# Patient Record
Sex: Male | Born: 1975 | Race: White | Hispanic: No | Marital: Married | Smoking: Former smoker
Health system: Southern US, Community
[De-identification: ages and names within clinical notes are randomized; demographics above are authoritative.]

## PROBLEM LIST (undated history)

## (undated) DIAGNOSIS — T7840XA Allergy, unspecified, initial encounter: Secondary | ICD-10-CM

## (undated) DIAGNOSIS — K08409 Partial loss of teeth, unspecified cause, unspecified class: Secondary | ICD-10-CM

## (undated) DIAGNOSIS — S92909A Unspecified fracture of unspecified foot, initial encounter for closed fracture: Secondary | ICD-10-CM

## (undated) HISTORY — DX: Allergy, unspecified, initial encounter: T78.40XA

## (undated) HISTORY — DX: Partial loss of teeth, unspecified cause, unspecified class: K08.409

## (undated) HISTORY — PX: MOUTH SURGERY: SHX715

## (undated) HISTORY — DX: Unspecified fracture of unspecified foot, initial encounter for closed fracture: S92.909A

---

## 2004-12-07 ENCOUNTER — Ambulatory Visit: Payer: Self-pay | Admitting: Family Medicine

## 2006-04-25 ENCOUNTER — Ambulatory Visit: Payer: Self-pay | Admitting: Internal Medicine

## 2006-12-04 DIAGNOSIS — J309 Allergic rhinitis, unspecified: Secondary | ICD-10-CM | POA: Insufficient documentation

## 2007-01-15 ENCOUNTER — Ambulatory Visit: Payer: Self-pay | Admitting: Family Medicine

## 2007-01-15 LAB — CONVERTED CEMR LAB
Albumin: 4.3 g/dL (ref 3.5–5.2)
Alkaline Phosphatase: 44 units/L (ref 39–117)
BUN: 14 mg/dL (ref 6–23)
Basophils Absolute: 0 10*3/uL (ref 0.0–0.1)
CO2: 32 meq/L (ref 19–32)
Cholesterol: 197 mg/dL (ref 0–200)
Eosinophils Absolute: 0.2 10*3/uL (ref 0.0–0.6)
GFR calc Af Amer: 129 mL/min
GFR calc non Af Amer: 107 mL/min
HDL: 61.7 mg/dL (ref >39.0)
Hemoglobin: 15.4 g/dL (ref 13.0–17.0)
Lymphocytes Relative: 35.7 % (ref 12.0–46.0)
MCHC: 34.3 g/dL (ref 30.0–36.0)
MCV: 96.4 fL (ref 78.0–100.0)
Monocytes Absolute: 0.7 10*3/uL (ref 0.2–0.7)
Monocytes Relative: 11.1 % — ABNORMAL HIGH (ref 3.0–11.0)
Neutro Abs: 3.2 10*3/uL (ref 1.4–7.7)
Potassium: 4 meq/L (ref 3.5–5.1)
Total Protein: 7 g/dL (ref 6.0–8.3)
Triglycerides: 82 mg/dL (ref 0–149)

## 2007-01-22 ENCOUNTER — Ambulatory Visit: Payer: Self-pay | Admitting: Family Medicine

## 2008-02-24 ENCOUNTER — Telehealth: Payer: Self-pay | Admitting: Family Medicine

## 2008-10-21 ENCOUNTER — Ambulatory Visit: Payer: Self-pay | Admitting: Family Medicine

## 2008-10-21 DIAGNOSIS — B977 Papillomavirus as the cause of diseases classified elsewhere: Secondary | ICD-10-CM | POA: Insufficient documentation

## 2008-10-21 DIAGNOSIS — H9193 Unspecified hearing loss, bilateral: Secondary | ICD-10-CM | POA: Insufficient documentation

## 2008-11-04 ENCOUNTER — Ambulatory Visit: Payer: Self-pay | Admitting: Family Medicine

## 2009-01-08 ENCOUNTER — Ambulatory Visit: Payer: Self-pay | Admitting: Family Medicine

## 2009-01-08 DIAGNOSIS — D1039 Benign neoplasm of other parts of mouth: Secondary | ICD-10-CM | POA: Insufficient documentation

## 2010-04-22 ENCOUNTER — Ambulatory Visit
Admission: RE | Admit: 2010-04-22 | Discharge: 2010-04-22 | Payer: Self-pay | Source: Home / Self Care | Attending: Family Medicine | Admitting: Family Medicine

## 2010-04-22 DIAGNOSIS — F17200 Nicotine dependence, unspecified, uncomplicated: Secondary | ICD-10-CM | POA: Insufficient documentation

## 2010-04-28 NOTE — Assessment & Plan Note (Signed)
Summary: bump on back of tongue/cold/njr   Vital Signs:  Patient profile:   35 year old male Height:      67.5 inches Weight:      147 pounds BMI:     22.77 Temp:     98.4 degrees F oral BP sitting:   104 / 70  (left arm) Cuff size:   regular  Vitals Entered By: Kern Reap CMA Duncan Dull) (April 22, 2010 11:14 AM) CC: bumps on tongue   CC:  bumps on tongue.  History of Present Illness: Ledarius is a 35 year old male, who comes in today because of a viral syndrome, and some irritation of this posterior tongue, and his real concern is she would like to quit smoking.  He said a viral syndrome for for 5 days.  He noticed some bumps in the back of his tongue.  He looked up on the Internet and found a smoking-related cancer, etc., and now he would   like to quit smoking  He's not been a heavy smoker.  He only smokes about 3 cigarettes per day, but does roll his own  Allergies: No Known Drug Allergies  Past History:  Past medical, surgical, family and social histories (including risk factors) reviewed, and no changes noted (except as noted below).  Past Medical History: Reviewed history from 12/04/2006 and no changes required. Allergic rhinitis Allergies  Past Surgical History: Reviewed history from 12/04/2006 and no changes required. Denies surgical history  Family History: Reviewed history from 12/04/2006 and no changes required. Family History Diabetes 1st degree relative Family History Hypertension Family History of Arthritis  Social History: Reviewed history from 01/22/2007 and no changes required. Occupation:[employer] grass[employer] Single Never Smoked Alcohol use-yes Drug use-no Regular exercise-yes  Review of Systems      See HPI  Physical Exam  General:  Well-developed,well-nourished,in no acute distress; alert,appropriate and cooperative throughout examination Head:  Normocephalic and atraumatic without obvious abnormalities. No apparent alopecia or  balding. Eyes:  No corneal or conjunctival inflammation noted. EOMI. Perrla. Funduscopic exam benign, without hemorrhages, exudates or papilledema. Vision grossly normal. Ears:  External ear exam shows no significant lesions or deformities.  Otoscopic examination reveals clear canals, tympanic membranes are intact bilaterally without bulging, retraction, inflammation or discharge. Hearing is grossly normal bilaterally. Nose:  External nasal examination shows no deformity or inflammation. Nasal mucosa are pink and moist without lesions or exudates. Mouth:  the oral cavity is normal.  There is some hyperpigmentation of the papillae consistent with either a viral syndrome or reaction to tobacco or above Neck:  No deformities, masses, or tenderness noted.   Problems:  Medical Problems Added: 1)  Dx of Tobacco Use  (ICD-305.1) 2)  Dx of Viral Infection-unspec  (ICD-079.99)  Impression & Recommendations:  Problem # 1:  TOBACCO USE (ICD-305.1) Assessment Unchanged  His updated medication list for this problem includes:    Chantix Continuing Month Pak 1 Mg Tabs (Varenicline tartrate) ..... Uad  Orders: Tobacco use cessation intermediate 3-10 minutes (16109)  Problem # 2:  VIRAL INFECTION-UNSPEC (ICD-079.99) Assessment: New  His updated medication list for this problem includes:    Hydromet 5-1.5 Mg/41ml Syrp (Hydrocodone-homatropine) .Marland Kitchen... 1/2 to 1 tsp at bedtime as needed  Orders: Tobacco use cessation intermediate 3-10 minutes (99406)  Complete Medication List: 1)  Claritin 10 Mg Tabs (Loratadine) .... Prn 2)  Flonase 50 Mcg/act Susp (Fluticasone propionate) .... Prn 3)  Hydromet 5-1.5 Mg/81ml Syrp (Hydrocodone-homatropine) .... 1/2 to 1 tsp at bedtime as needed 4)  Chantix Continuing Month Pak 1 Mg Tabs (Varenicline tartrate) .... Uad  Patient Instructions: 1)  for your viral syndrome.  Drink lots of water, Tylenol or Motrin p.r.n., Hydromet one half to 1 teaspoon nightly p.r.n.  cough. 2)  Chantix take one half tab q.a.m. x 4 months and either tapper ror stopped completely.  Now Prescriptions: CHANTIX CONTINUING MONTH PAK 1 MG TABS (VARENICLINE TARTRATE) UAD  #1 x 2   Entered and Authorized by:   Roderick Pee MD   Signed by:   Roderick Pee MD on 04/22/2010   Method used:   Print then Give to Patient   RxID:   [id number] HYDROMET 5-1.5 MG/5ML SYRP (HYDROCODONE-HOMATROPINE) 1/2 to 1 tsp at bedtime as needed  #4oz x 1   Entered and Authorized by:   Roderick Pee MD   Signed by:   Roderick Pee MD on 04/22/2010   Method used:   Print then Give to Patient   RxID:   [id number]    Orders Added: 1)  Est. Patient Level III [84696] 2)  Tobacco use cessation intermediate 3-10 minutes [29528]

## 2010-08-09 ENCOUNTER — Encounter: Payer: Self-pay | Admitting: Family Medicine

## 2010-08-11 ENCOUNTER — Ambulatory Visit: Payer: Self-pay | Admitting: Internal Medicine

## 2010-08-12 ENCOUNTER — Ambulatory Visit: Payer: Self-pay | Admitting: Internal Medicine

## 2010-10-03 ENCOUNTER — Encounter: Payer: Self-pay | Admitting: Family Medicine

## 2010-10-03 ENCOUNTER — Ambulatory Visit (INDEPENDENT_AMBULATORY_CARE_PROVIDER_SITE_OTHER): Payer: BC Managed Care – PPO | Admitting: Family Medicine

## 2010-10-03 VITALS — BP 112/78 | Temp 98.3°F | Wt 149.0 lb

## 2010-10-03 DIAGNOSIS — L247 Irritant contact dermatitis due to plants, except food: Secondary | ICD-10-CM | POA: Insufficient documentation

## 2010-10-03 DIAGNOSIS — L255 Unspecified contact dermatitis due to plants, except food: Secondary | ICD-10-CM

## 2010-10-03 MED ORDER — PREDNISONE 20 MG PO TABS: ORAL_TABLET | ORAL | Status: DC

## 2010-10-03 NOTE — Patient Instructions (Signed)
Take the prednisone as directed.  Return p.r.n. 

## 2010-10-03 NOTE — Progress Notes (Signed)
  Subjective:    Patient ID: Roy Webb, male    DOB: 1975-06-06, 35 y.o.   MRN: [id number]  HPITodd is a 35 year old male, who comes in today for evaluation.  The contact them as tightness for 4 days.  The lesions now are blistered and involved the left arm trunk and face.    Review of Systems General and dermatologic review of systems otherwise negative.    Objective:   Physical Exam    No acute distress.  Examination of the skin shows testicular lesions on the left arm 3 on the face and some on the trunk consistent with a contact dermatitis   Assessment & Plan:  Contact dermatitis.  Plan prednisone burst and taper return p.r.n.

## 2012-02-08 ENCOUNTER — Ambulatory Visit (INDEPENDENT_AMBULATORY_CARE_PROVIDER_SITE_OTHER): Payer: BC Managed Care – PPO | Admitting: Family Medicine

## 2012-02-08 ENCOUNTER — Encounter: Payer: Self-pay | Admitting: Family Medicine

## 2012-02-08 VITALS — BP 110/80 | Temp 98.4°F | Wt 155.0 lb

## 2012-02-08 DIAGNOSIS — S91339A Puncture wound without foreign body, unspecified foot, initial encounter: Secondary | ICD-10-CM

## 2012-02-08 DIAGNOSIS — F17200 Nicotine dependence, unspecified, uncomplicated: Secondary | ICD-10-CM

## 2012-02-08 DIAGNOSIS — S91309A Unspecified open wound, unspecified foot, initial encounter: Secondary | ICD-10-CM

## 2012-02-08 NOTE — Progress Notes (Signed)
  Subjective:    Patient ID: Roy Webb, male    DOB: Nov 12, 1975, 36 y.o.   MRN: [id number]  HPI Roy Webb is a 36 year old married male X. smoker times one year on the Chantix program who comes in today for evaluation of a puncture wound  And Tuesday a rusty nail went through his sandal it looks like was about a quarter-inch penetration to the ventral portion of his right foot. He cleaned the wound and comes in today for evaluation. Last tetanus booster over 10 years ago therefore given a tetanus booster today.      Review of Systems    general review of systems otherwise negative Objective:   Physical Exam Well-developed well-nourished male in no acute distress examination of foot shows a 3 mm puncture wound with some erythema no tenderness no pus       Assessment & Plan:  Puncture wound treat symptomatically return when necessary

## 2012-02-08 NOTE — Patient Instructions (Signed)
Clean and dress twice daily  Return when necessary

## 2012-12-19 ENCOUNTER — Telehealth: Payer: Self-pay | Admitting: Family Medicine

## 2012-12-19 NOTE — Telephone Encounter (Signed)
Wife is a carrier of hep b. Pt has a newborn, and newborn was vaccinated. Pt was tested and was neg for the antibody. Needs these series of shots pls advise.

## 2012-12-20 NOTE — Telephone Encounter (Signed)
Okay to schedule. Please offer the Twinrix which is a series of 3 vaccines that contain Hep A and Hep B

## 2012-12-27 ENCOUNTER — Ambulatory Visit (INDEPENDENT_AMBULATORY_CARE_PROVIDER_SITE_OTHER): Payer: BC Managed Care – PPO | Admitting: *Deleted

## 2012-12-27 DIAGNOSIS — Z23 Encounter for immunization: Secondary | ICD-10-CM

## 2013-01-09 ENCOUNTER — Ambulatory Visit (INDEPENDENT_AMBULATORY_CARE_PROVIDER_SITE_OTHER): Payer: BC Managed Care – PPO | Admitting: Family Medicine

## 2013-01-09 ENCOUNTER — Encounter: Payer: Self-pay | Admitting: Family Medicine

## 2013-01-09 VITALS — BP 120/70 | HR 80 | Temp 97.9°F | Resp 20 | Wt 152.0 lb

## 2013-01-09 DIAGNOSIS — J069 Acute upper respiratory infection, unspecified: Secondary | ICD-10-CM | POA: Insufficient documentation

## 2013-01-09 MED ORDER — HYDROCODONE-HOMATROPINE 5-1.5 MG/5ML PO SYRP: 5.0000 mL | ORAL_SOLUTION | Freq: Three times a day (TID) | ORAL | Status: DC | PRN

## 2013-01-09 NOTE — Patient Instructions (Signed)
Hydromet.........Marland Kitchen 1/2-1 teaspoon each bedtime when necessary  Drink lots of water  Tylenol 3 general cold symptoms  Continue the nasal spray one-shot a piece nostril twice daily

## 2013-01-09 NOTE — Progress Notes (Signed)
  Subjective:    Patient ID: Roy Webb, male    DOB: [birth year], 37 y.o.   MRN: [id number]  HPI Matthew is a 37 year old male new father,,,,,,,,,,,, 71-month-old son at home,,,,,,,,,,, who comes in today for evaluation of viral syndrome for 2 days  He's had head congestion postnasal drip and cough. No fever earache nausea vomiting or diarrhea. No contact with anybody ill except he was in [place] last week and was around a lot of people that cough and cold. No foreign travel   Review of Systems    review of systems negative Objective:   Physical Exam  Well-developed well-nourished male in no acute distress HEENT negative the neck was supple without adenopathy lungs are clear      Assessment & Plan:  Viral syndrome plan treat symptomatically return when necessary

## 2013-01-27 ENCOUNTER — Ambulatory Visit (INDEPENDENT_AMBULATORY_CARE_PROVIDER_SITE_OTHER): Payer: BC Managed Care – PPO | Admitting: *Deleted

## 2013-01-27 DIAGNOSIS — Z23 Encounter for immunization: Secondary | ICD-10-CM

## 2013-04-25 ENCOUNTER — Ambulatory Visit (INDEPENDENT_AMBULATORY_CARE_PROVIDER_SITE_OTHER): Payer: BC Managed Care – PPO | Admitting: *Deleted

## 2013-04-25 DIAGNOSIS — Z23 Encounter for immunization: Secondary | ICD-10-CM

## 2014-05-11 ENCOUNTER — Encounter: Payer: Self-pay | Admitting: Family Medicine

## 2014-05-11 ENCOUNTER — Ambulatory Visit (INDEPENDENT_AMBULATORY_CARE_PROVIDER_SITE_OTHER): Payer: BLUE CROSS/BLUE SHIELD | Admitting: Family Medicine

## 2014-05-11 VITALS — BP 90/62 | Temp 98.4°F | Wt 162.0 lb

## 2014-05-11 DIAGNOSIS — B9789 Other viral agents as the cause of diseases classified elsewhere: Secondary | ICD-10-CM

## 2014-05-11 DIAGNOSIS — J329 Chronic sinusitis, unspecified: Secondary | ICD-10-CM

## 2014-05-11 DIAGNOSIS — B349 Viral infection, unspecified: Secondary | ICD-10-CM

## 2014-05-11 MED ORDER — PREDNISONE 20 MG PO TABS: ORAL_TABLET | ORAL | Status: DC

## 2014-05-11 NOTE — Patient Instructions (Signed)
Viral Sinusitis   Treat with prednisone per ENT recs  Ear is slightly red and could be developing an ear infection  Come back to see [place] if fevers, worsening ear pain, or persistent symptoms after course of prednisone

## 2014-05-11 NOTE — Progress Notes (Signed)
  Garret Reddish, MD Phone: [phone]  Subjective:   Roy Webb is a 39 y.o. year old very pleasant male patient who presents with the following:  Sore throat, cough, L ear pain Symptoms started Friday. Gradually progressive. Sore throat rated as mild and has not noted any purulence on tonsils. Sunday L ear pain started and has progressed. History of sinus and ear infections seen by ENT Dr. Benjamine Mola. States usually able to treat with prednisone if catches early. Not recommended by Dr. Benjamine Mola for early antibiotics. Has not tried any treatments. Also has sinus congestion, nasal congestion that seem to be worsening.  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 fluticasone (FLONASE) 50 MCG/ACT nasal spray 2 sprays by Nasal route as needed.      . predniSONE (DELTASONE) 20 MG tablet Take 2 tabs for 3 days, then 1 tab for 4 days, then 1/2 tab for 2 days, then stop 11 tablet 0   No current facility-administered medications for this visit.    Objective: BP 90/62 mmHg  Temp(Src) 98.4 F (36.9 C)  Wt 162 lb (73.483 kg) Gen: NAD, resting comfortably on table ENT: Mucous membranes are moist. TM normal on right, very mild erythema on Left, oropharynx mild erythema no lymphadenopathy.  CV: RRR no murmurs rubs or gallops Lungs: CTAB no crackles, wheeze, rhonchi Ext: no edema Skin: warm, dry, no rash   Assessment/Plan:  Viral Sinusitis   Treat with prednisone 9 day taper per ENT recs   L Ear  could be developing an ear infection bu tnot clear at this point  Return  if fevers, worsening ear pain, or persistent symptoms after course of prednisone. Can also return to ENT as he usually does.   Meds ordered this encounter  Medications  . predniSONE (DELTASONE) 20 MG tablet    Sig: Take 2 tabs for 3 days, then 1 tab for 4 days, then 1/2 tab for 2  days, then stop    Dispense:  11 tablet    Refill:  0

## 2014-08-09 ENCOUNTER — Emergency Department (HOSPITAL_COMMUNITY)
Admission: EM | Admit: 2014-08-09 | Discharge: 2014-08-09 | Disposition: A | Discharge status: Home / Self Care | Payer: BLUE CROSS/BLUE SHIELD | Attending: Emergency Medicine | Admitting: Emergency Medicine

## 2014-08-09 ENCOUNTER — Encounter (HOSPITAL_COMMUNITY): Payer: Self-pay | Admitting: Emergency Medicine

## 2014-08-09 DIAGNOSIS — Z87891 Personal history of nicotine dependence: Secondary | ICD-10-CM | POA: Insufficient documentation

## 2014-08-09 DIAGNOSIS — Z0489 Encounter for examination and observation for other specified reasons: Secondary | ICD-10-CM

## 2014-08-09 DIAGNOSIS — Z0283 Encounter for blood-alcohol and blood-drug test: Secondary | ICD-10-CM | POA: Diagnosis present

## 2014-08-09 LAB — ETHANOL: Alcohol, Ethyl (B): 119 mg/dL — ABNORMAL HIGH (ref <5)

## 2014-08-09 NOTE — ED Notes (Signed)
Pt was arrested for DWI @ 0140 tonight, would like to have etoh lab draw

## 2014-08-09 NOTE — ED Provider Notes (Signed)
CSN: [id number]     Arrival date & time 08/09/14  0448 History   First MD Initiated Contact with Patient 08/09/14 [phone]     Chief Complaint  Patient presents with  . wants etoh lab draw      (Consider location/radiation/quality/duration/timing/severity/associated sxs/prior Treatment) HPI Comments: He presents after being stopped and charged with DWI earlier this evening requesting blood alcohol test. No physical complaints of pain, vomiting, or injury.  The history is provided by the patient. No language interpreter was used.    Past Medical History  Diagnosis Date  . Allergy    History reviewed. No pertinent past surgical history. Family History  Problem Relation Age of Onset  . Diabetes      fhx  . Hypertension      fhx  . Arthritis      fhx   History  Substance Use Topics  . Smoking status: Former Research scientist (life sciences)  . Smokeless tobacco: Not on file  . Alcohol Use: Yes    Review of Systems  Respiratory: Negative.   Cardiovascular: Negative.   Gastrointestinal: Negative.   Musculoskeletal: Negative.   Skin: Negative.   Neurological: Negative.       Allergies  Review of patient's allergies indicates no known allergies.  Home Medications   Prior to Admission medications   Medication Sig Start Date End Date Taking? Authorizing Provider  fluticasone (FLONASE) 50 MCG/ACT nasal spray 2 sprays by Nasal route as needed.      Historical Provider, MD  predniSONE (DELTASONE) 20 MG tablet Take 2 tabs for 3 days, then 1 tab for 4 days, then 1/2 tab for 2 days, then stop 05/11/14   Marin Olp, MD   BP 125/69 mmHg  Pulse 108  Temp(Src) 98 F (36.7 C) (Oral)  Resp 19  Ht 5\' 8"  (1.727 m)  Wt 160 lb (72.576 kg)  BMI 24.33 kg/m2  SpO2 97% Physical Exam  Constitutional: He is oriented to person, place, and time. He appears well-developed and well-nourished.  Neck: Normal range of motion.  Pulmonary/Chest: Effort normal.  Musculoskeletal: Normal range of motion.   Neurological: He is alert and oriented to person, place, and time.  Skin: Skin is warm and dry.  Psychiatric: He has a normal mood and affect.    ED Course  Procedures (including critical care time) Labs Review Labs Reviewed  ETHANOL - Abnormal; Notable for the following:    Alcohol, Ethyl (B) 119 (*)    All other components within normal limits    Imaging Review No results found.   EKG Interpretation None      MDM   Final diagnoses:  None    1. Encounter for blood alcohol test  Here for blood test which was performed at patient's request.     Charlann Lange, PA-C 08/09/14 0602  Linton Flemings, MD 08/09/14 [phone]

## 2014-08-09 NOTE — Discharge Instructions (Signed)
Ethanol This is a test of the blood alcohol level and is used to determine whether your level will impair your driving or other activities and also to determine the possibility of overdose (alcohol poisoning). PREPARATION FOR TEST No preparation or fasting is necessary. NORMAL FINDINGS  Negative  Possible critical values are greater than 300 mg/dl or greater than 65 mmol/L (SI units). Ranges for normal findings may vary among different laboratories and hospitals. You should always check with your doctor after having lab work or other tests done to discuss the meaning of your test results and whether your values are considered within normal limits. MEANING OF TEST  Your caregiver will go over the test results with you and discuss the importance and meaning of your results, as well as treatment options and the need for additional tests if necessary. OBTAINING THE TEST RESULTS  It is your responsibility to obtain your test results. Ask the lab or department performing the test when and how you will get your results. Document Released: 04/05/2004 Document Revised: 06/05/2011 Document Reviewed: 02/21/2008 [facility] Patient Information 2015 [place], [place]. This information is not intended to replace advice given to you by your health care provider. Make sure you discuss any questions you have with your health care provider.   Results for orders placed or performed during the hospital encounter of 08/09/14  Ethanol  Result Value Ref Range   Alcohol, Ethyl (B) 119 (H) <5 mg/dL

## 2015-01-26 ENCOUNTER — Ambulatory Visit (INDEPENDENT_AMBULATORY_CARE_PROVIDER_SITE_OTHER): Payer: BLUE CROSS/BLUE SHIELD | Admitting: Family Medicine

## 2015-01-26 ENCOUNTER — Encounter: Payer: Self-pay | Admitting: Family Medicine

## 2015-01-26 VITALS — BP 110/80 | Temp 98.4°F | Wt 155.0 lb

## 2015-01-26 DIAGNOSIS — J301 Allergic rhinitis due to pollen: Secondary | ICD-10-CM | POA: Diagnosis not present

## 2015-01-26 DIAGNOSIS — Z23 Encounter for immunization: Secondary | ICD-10-CM | POA: Diagnosis not present

## 2015-01-26 MED ORDER — PREDNISONE 20 MG PO TABS: ORAL_TABLET | ORAL | Status: DC

## 2015-01-26 NOTE — Patient Instructions (Signed)
Prednisone 20 mg..........Marland Kitchen 1 tab 5 days, half a tab 5 days, then a half a tab Monday Wednesday Friday for a two-week taper  Over-the-counter nasal spray...Marland KitchenMarland KitchenMarland Kitchen 1 shot up each nostril at bedtime....... 5 night limit  Do not use pseudoephedrine  Dogs and cats should be out of the house

## 2015-01-26 NOTE — Progress Notes (Signed)
Pre visit review using our clinic review tool, if applicable. No additional management support is needed unless otherwise documented below in the visit note. 

## 2015-01-26 NOTE — Progress Notes (Signed)
   Subjective:    Patient ID: Roy Webb, male    DOB: 30-Oct-1975, 39 y.o.   MRN: [id number]  HPI Roy Webb is a 39 year old married male nonsmoker who comes in today because of allergic rhinitis  He has stated developed a sudden onset of head congestion and sinus pressure couldn't bruits stuffy nose. He has history of allergic rhinitis usually in the fall. He uses a steroid nasal spray twice daily.  He has dogs and cats at home live in the house   Review of Systems    review of systems negative Objective:   Physical Exam  Well-developed well-nourished male no acute distress vital signs stable he is afebrile HEENT were negative except for marked bilateral nasal edema. Neck was supple no adenopathy lungs are clear      Assessment & Plan:  Allergic rhinitis....... Severe........Marland Kitchen prednisone burst and taper

## 2015-12-07 ENCOUNTER — Encounter: Payer: Self-pay | Admitting: Family Medicine

## 2015-12-07 ENCOUNTER — Ambulatory Visit (INDEPENDENT_AMBULATORY_CARE_PROVIDER_SITE_OTHER): Payer: BLUE CROSS/BLUE SHIELD | Admitting: Family Medicine

## 2015-12-07 VITALS — BP 106/72 | HR 80 | Temp 98.5°F | Wt 143.8 lb

## 2015-12-07 DIAGNOSIS — R110 Nausea: Secondary | ICD-10-CM | POA: Diagnosis not present

## 2015-12-07 DIAGNOSIS — Z23 Encounter for immunization: Secondary | ICD-10-CM

## 2015-12-07 DIAGNOSIS — K920 Hematemesis: Secondary | ICD-10-CM | POA: Diagnosis not present

## 2015-12-07 NOTE — Progress Notes (Signed)
Pre visit review using our clinic review tool, if applicable. No additional management support is needed unless otherwise documented below in the visit note. 

## 2015-12-07 NOTE — Patient Instructions (Signed)
Suspect this was a mild short term tear. Glad no continued issues with blood.   Let's check stool to make sure not bleeding the other direction.   Sign release of information at the check out desk for records from Dr. Earlean Shawl including labs for hepatitis testing  See you in November- labs a week before. Ask lab to add 1x HIV screen to your labs.   Flu shot today

## 2015-12-07 NOTE — Progress Notes (Signed)
Subjective:  Roy Webb is a 40 y.o. year old very pleasant male patient who presents for/with See problem oriented charting ROS- no fever, chills. Did have some nausea along with vomiting and at end of vomiting had some blood in mouth.see any ROS included in HPI as well.   Past Medical History-  Patient Active Problem List   Diagnosis Date Noted  . Viral URI 01/09/2013  . Contact dermatitis and eczema due to plant 10/03/2010  . BENIGN NEOPLASM OTHER&UNSPECIFIED PARTS MOUTH 01/08/2009  . 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 HEARING LOSS, BILATERAL 10/21/2008  . Allergic rhinitis 12/04/2006    Medications- reviewed and updated Current Outpatient Prescriptions  Medication Sig Dispense Refill  . fluticasone (FLONASE) 50 MCG/ACT nasal spray 2 sprays by Nasal route as needed.       No current facility-administered medications for this visit.     Objective: BP 106/72 (BP Location: Left Arm, Patient Position: Sitting, Cuff Size: Normal)   Pulse 80   Temp 98.5 F (36.9 C) (Oral)   Wt 143 lb 12.8 oz (65.2 kg)   SpO2 96%   BMI 21.86 kg/m  Gen: NAD, resting comfortably Mucous membranes are moist. Oropharynx normal- no obvious source of bleeding CV: RRR no murmurs rubs or gallops Lungs: CTAB no crackles, wheeze, rhonchi Abdomen: soft/nontender/nondistended/normal bowel sounds. No rebound or guarding.  Ext: no edema Skin: warm, dry  Assessment/Plan:  Hematemesis S: watching football on Sunday- had mianly beer as well as some shots of wisky. Got home felt fine- drank some water. Upset stomach- threw up for about a minute and at the end of it noted mouthful of blood. No further blood. No melena  Drinks about 1 beverage a day at baseline or about 5-6 per week usually, probably had 8 beers or 2-3 shots that day (perhaps 1-2 x a month) A/P: we discussed limiting alcohol consumption to perhaps 4 beers at a time. Prior higher alcohol intake but over last year at least 6 drinks on average per week. Doubt  varices and do not believe EGD helpful at this point unless recurrent. We will get stool cards to make sure no blood in stool primarily for reassurance for wife who is concerned. We discussed getting CBC/CMET but will defer until physical labs in november   November cpe/establish  Orders Placed This Encounter  Procedures  . Flu Vaccine QUAD 36+ mos IM  . POC Hemoccult Bld/Stl (3-Cd Home Screen)    Send home    Standing Status:   Future    Standing Expiration Date:   12/06/2016   Return precautions advised.  Garret Reddish, MD

## 2016-01-07 ENCOUNTER — Other Ambulatory Visit (INDEPENDENT_AMBULATORY_CARE_PROVIDER_SITE_OTHER): Payer: BLUE CROSS/BLUE SHIELD

## 2016-01-07 DIAGNOSIS — K920 Hematemesis: Secondary | ICD-10-CM

## 2016-01-07 LAB — POC HEMOCCULT BLD/STL (HOME/3-CARD/SCREEN)
Card #2 Fecal Occult Blod, POC: NEGATIVE
Card #3 Fecal Occult Blood, POC: NEGATIVE
FECAL OCCULT BLD: NEGATIVE

## 2016-02-15 ENCOUNTER — Other Ambulatory Visit (INDEPENDENT_AMBULATORY_CARE_PROVIDER_SITE_OTHER): Payer: BLUE CROSS/BLUE SHIELD

## 2016-02-15 DIAGNOSIS — Z0000 Encounter for general adult medical examination without abnormal findings: Secondary | ICD-10-CM | POA: Diagnosis not present

## 2016-02-15 LAB — POC URINALSYSI DIPSTICK (AUTOMATED)
BILIRUBIN UA: NEGATIVE
Glucose, UA: NEGATIVE
KETONES UA: NEGATIVE
LEUKOCYTES UA: NEGATIVE
NITRITE UA: NEGATIVE
PH UA: 7
RBC UA: NEGATIVE
Spec Grav, UA: 1.02
Urobilinogen, UA: 0.2

## 2016-02-15 LAB — BASIC METABOLIC PANEL
BUN: 15 mg/dL (ref 6–23)
CHLORIDE: 104 meq/L (ref 96–112)
CO2: 31 meq/L (ref 19–32)
Calcium: 9.6 mg/dL (ref 8.4–10.5)
Creatinine, Ser: 0.88 mg/dL (ref 0.40–1.50)
GFR: 103.03 mL/min (ref >60.00)
Glucose, Bld: 103 mg/dL — ABNORMAL HIGH (ref 70–99)
POTASSIUM: 4.5 meq/L (ref 3.5–5.1)
SODIUM: 140 meq/L (ref 135–145)

## 2016-02-15 LAB — CBC WITH DIFFERENTIAL/PLATELET
BASOS PCT: 0.6 % (ref 0.0–3.0)
Basophils Absolute: 0 10*3/uL (ref 0.0–0.1)
EOS PCT: 1.7 % (ref 0.0–5.0)
Eosinophils Absolute: 0.1 10*3/uL (ref 0.0–0.7)
HEMATOCRIT: 44.3 % (ref 39.0–52.0)
HEMOGLOBIN: 15.2 g/dL (ref 13.0–17.0)
LYMPHS PCT: 23.3 % (ref 12.0–46.0)
Lymphs Abs: 1.3 10*3/uL (ref 0.7–4.0)
MCHC: 34.2 g/dL (ref 30.0–36.0)
MCV: 93.7 fl (ref 78.0–100.0)
MONOS PCT: 10.4 % (ref 3.0–12.0)
Monocytes Absolute: 0.6 10*3/uL (ref 0.1–1.0)
NEUTROS ABS: 3.5 10*3/uL (ref 1.4–7.7)
Neutrophils Relative %: 64 % (ref 43.0–77.0)
PLATELETS: 326 10*3/uL (ref 150.0–400.0)
RBC: 4.73 Mil/uL (ref 4.22–5.81)
RDW: 13.3 % (ref 11.5–15.5)
WBC: 5.5 10*3/uL (ref 4.0–10.5)

## 2016-02-15 LAB — HEPATIC FUNCTION PANEL
ALBUMIN: 4.4 g/dL (ref 3.5–5.2)
ALT: 14 U/L (ref 0–53)
AST: 18 U/L (ref 0–37)
Alkaline Phosphatase: 41 U/L (ref 39–117)
Bilirubin, Direct: 0.1 mg/dL (ref 0.0–0.3)
TOTAL PROTEIN: 7 g/dL (ref 6.0–8.3)
Total Bilirubin: 0.7 mg/dL (ref 0.2–1.2)

## 2016-02-15 LAB — LIPID PANEL
CHOL/HDL RATIO: 3
Cholesterol: 194 mg/dL (ref 0–200)
HDL: 63.8 mg/dL (ref >39.00)
LDL Cholesterol: 116 mg/dL — ABNORMAL HIGH (ref 0–99)
NONHDL: 130.62
TRIGLYCERIDES: 73 mg/dL (ref 0.0–149.0)
VLDL: 14.6 mg/dL (ref 0.0–40.0)

## 2016-02-15 LAB — TSH: TSH: 1.85 u[IU]/mL (ref 0.35–4.50)

## 2016-02-22 ENCOUNTER — Encounter: Payer: Self-pay | Admitting: Family Medicine

## 2016-02-22 ENCOUNTER — Ambulatory Visit (INDEPENDENT_AMBULATORY_CARE_PROVIDER_SITE_OTHER): Payer: BLUE CROSS/BLUE SHIELD | Admitting: Family Medicine

## 2016-02-22 VITALS — BP 122/82 | HR 92 | Temp 99.0°F | Ht 67.25 in | Wt 149.4 lb

## 2016-02-22 DIAGNOSIS — Z0000 Encounter for general adult medical examination without abnormal findings: Secondary | ICD-10-CM | POA: Diagnosis not present

## 2016-02-22 NOTE — Progress Notes (Signed)
Pre visit review using our clinic review tool, if applicable. No additional management support is needed unless otherwise documented below in the visit note. 

## 2016-02-22 NOTE — Patient Instructions (Signed)
Let's trial flonase daily for a month. If 2 weeks from now not improving also add zyrtec. If neither of these works over the next month- lets get a chest x-ray given how long term your cough has been  Sugar hair high but was likely the cookie- so no major concern   ;
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
## 2016-03-01 ENCOUNTER — Encounter: Payer: Self-pay | Admitting: Family Medicine

## 2016-03-01 ENCOUNTER — Ambulatory Visit (INDEPENDENT_AMBULATORY_CARE_PROVIDER_SITE_OTHER): Payer: BLUE CROSS/BLUE SHIELD | Admitting: Family Medicine

## 2016-03-01 VITALS — BP 102/68 | HR 98 | Temp 97.5°F | Wt 150.1 lb

## 2016-03-01 DIAGNOSIS — J00 Acute nasopharyngitis [common cold]: Secondary | ICD-10-CM

## 2016-03-01 MED ORDER — PREDNISONE 10 MG PO TABS: ORAL_TABLET | ORAL | 0 refills | Status: DC

## 2016-03-01 NOTE — Patient Instructions (Signed)
Follow up for any fever, progressive facial pain/headaches, or other concerns.

## 2016-03-01 NOTE — Progress Notes (Signed)
Pre visit review using our clinic review tool, if applicable. No additional management support is needed unless otherwise documented below in the visit note. 
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
## 2016-06-01 ENCOUNTER — Encounter: Payer: Self-pay | Admitting: Family Medicine

## 2016-06-01 ENCOUNTER — Ambulatory Visit (INDEPENDENT_AMBULATORY_CARE_PROVIDER_SITE_OTHER): Payer: BLUE CROSS/BLUE SHIELD | Admitting: Family Medicine

## 2016-06-01 VITALS — BP 118/76 | HR 84 | Temp 98.7°F | Ht 67.25 in | Wt 147.6 lb

## 2016-06-01 DIAGNOSIS — M5412 Radiculopathy, cervical region: Secondary | ICD-10-CM | POA: Diagnosis not present

## 2016-06-01 MED ORDER — PREDNISONE 20 MG PO TABS: ORAL_TABLET | ORAL | 0 refills | Status: DC

## 2016-06-01 NOTE — Patient Instructions (Signed)
Suspect this is right cervical radiculopathy (nerve root irritated in right side of neck). Will trial prednisone 7 days. If not at least 50% better- pain to a 2-3/10- within 10 days then will consider sports medicine consult. Did not pursue imaging at this time but we could do this later if does not improve.

## 2016-06-01 NOTE — Progress Notes (Signed)
Pre visit review using our clinic review tool, if applicable. No additional management support is needed unless otherwise documented below in the visit note. 

## 2016-06-01 NOTE — Progress Notes (Signed)
Subjective:  Roy Webb is a 41 y.o. year old very pleasant male patient who presents for/with See problem oriented charting ROS- no weakness in hand. Does have some numbness/tingling into hand. Some radiation of pain into right arm. No issues on left side   Past Medical History-  Patient Active Problem List   Diagnosis Date Noted  . 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 Bilateral hearing loss 10/21/2008    Priority: Low  . Allergic rhinitis 12/04/2006    Priority: Low    Medications- reviewed and updated Current Outpatient Prescriptions  Medication Sig Dispense Refill  . fluticasone (FLONASE) 50 MCG/ACT nasal spray 2 sprays by Nasal route as needed.       No current facility-administered medications for this visit.     Objective: BP 118/76 (BP Location: Left Arm, Patient Position: Sitting, Cuff Size: Large)   Pulse 84   Temp 98.7 F (37.1 C) (Oral)   Ht 5' 7.25" (1.708 m)   Wt 147 lb 9.6 oz (67 kg)   SpO2 94%   BMI 22.95 kg/m  Gen: NAD, resting comfortably CV: RRR no murmurs rubs or gallops Lungs: CTAB no crackles, wheeze, rhonchi Abdomen: soft/nontender/nondistended/normal bowel sounds. No rebound or guarding.  Ext: no edema Skin: warm, dry Neuro: grossly normal, moves all extremities  Shoulder: Inspection reveals no abnormalities, atrophy or asymmetry. Palpation is normal with no tenderness over AC joint or bicipital groove. ROM is full in all planes and no pain with this- feels better with this actually Rotator cuff strength normal throughout.  Positive Spurling on right- numbness/tingling into right hand as well as pain into neck and shoulder.   Assessment/Plan:  Right cervical radiculopathy S: right shoulder blade pain and then also feeling numbness into right hand. Stretches, push ups - have helped it short term. Hot tub helps before bed and helps him get to sleep. Symptoms for about 2 weeks. Pain about 5/10 constant through the day and had been hard to  sleep before hot tub. Also has some pain into right neck. Feels like moving arm around and overhead helps his pain  States poor posture, on phone a lot and pinches to right side of neck, doing a lot of time on the mouse at work and with this feels numb from elbow down.  A/P: Suspect this is right cervical radiculopathy with positive spurling on the right. Will trial prednisone 7 days. If not at least 50% better- pain to a 2-3/10- within 10 days then will consider sports medicine consult. Did not pursue imaging at this time but we could do this later if does not improve.   Meds ordered this encounter  Medications  . predniSONE (DELTASONE) 20 MG tablet    Sig: Take 2 pills for 3 days, 1 pill for 4 days    Dispense:  10 tablet    Refill:  0   Return precautions advised.  Garret Reddish, MD

## 2016-06-12 ENCOUNTER — Encounter: Payer: Self-pay | Admitting: Family Medicine

## 2016-06-12 DIAGNOSIS — M5412 Radiculopathy, cervical region: Secondary | ICD-10-CM

## 2016-06-15 ENCOUNTER — Ambulatory Visit (INDEPENDENT_AMBULATORY_CARE_PROVIDER_SITE_OTHER): Payer: BLUE CROSS/BLUE SHIELD | Admitting: Sports Medicine

## 2016-06-15 ENCOUNTER — Ambulatory Visit (INDEPENDENT_AMBULATORY_CARE_PROVIDER_SITE_OTHER): Payer: BLUE CROSS/BLUE SHIELD

## 2016-06-15 ENCOUNTER — Ambulatory Visit: Payer: Self-pay

## 2016-06-15 ENCOUNTER — Encounter: Payer: Self-pay | Admitting: Sports Medicine

## 2016-06-15 VITALS — BP 110/80 | HR 84 | Ht 67.25 in | Wt 148.0 lb

## 2016-06-15 DIAGNOSIS — M79601 Pain in right arm: Secondary | ICD-10-CM | POA: Diagnosis not present

## 2016-06-15 DIAGNOSIS — M5412 Radiculopathy, cervical region: Secondary | ICD-10-CM | POA: Diagnosis not present

## 2016-06-15 DIAGNOSIS — M50321 Other cervical disc degeneration at C4-C5 level: Secondary | ICD-10-CM | POA: Diagnosis not present

## 2016-06-15 MED ORDER — GABAPENTIN 300 MG PO CAPS: ORAL_CAPSULE | ORAL | 1 refills | Status: DC
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
## 2016-06-15 NOTE — Assessment & Plan Note (Signed)
Symptoms are consistent with a right-sided radiculitis with possible double crush component with acute irritation of the median nerve secondary to using the blower excessively.  We will go ahead and start him on gabapentin as well as therapeutic exercises as outlined by AVS.  Follow-up in 6 weeks for clinical reevaluation and consideration of further advanced imaging of the cervical spine if persistent symptoms.

## 2016-06-15 NOTE — Patient Instructions (Addendum)
Please perform the exercise program that Jeneen Rinks has prepared for you and gone over in detail on a daily basis.  In addition to the handout you were provided you can access your program through: [email or web address]   Your unique program code is: [email or web address]

## 2016-08-01 ENCOUNTER — Ambulatory Visit: Payer: BLUE CROSS/BLUE SHIELD | Admitting: Sports Medicine

## 2016-12-13 ENCOUNTER — Ambulatory Visit (INDEPENDENT_AMBULATORY_CARE_PROVIDER_SITE_OTHER): Payer: BLUE CROSS/BLUE SHIELD | Admitting: Family Medicine

## 2016-12-13 ENCOUNTER — Encounter: Payer: Self-pay | Admitting: Family Medicine

## 2016-12-13 VITALS — BP 102/72 | HR 73 | Temp 98.6°F | Ht 67.25 in | Wt 148.4 lb

## 2016-12-13 DIAGNOSIS — R21 Rash and other nonspecific skin eruption: Secondary | ICD-10-CM | POA: Diagnosis not present

## 2016-12-13 DIAGNOSIS — Z23 Encounter for immunization: Secondary | ICD-10-CM | POA: Diagnosis not present

## 2016-12-13 MED ORDER — TRIAMCINOLONE ACETONIDE 0.1 % EX CREA: 1.0000 "application " | TOPICAL_CREAM | Freq: Two times a day (BID) | CUTANEOUS | 0 refills | Status: DC

## 2016-12-13 NOTE — Patient Instructions (Signed)
Dyshidrotic eczema - a less common form of eczema on the hand.  - I do think keeping ring off for now is reasonable.  - use triamcinolone cream covered with vaseline or eucerin twice a day for 10 days. May take 7 days off then restart if needed if it is helping - if its getting worse on this treatment let us know immediately. Depending on presentation may change to bacterial or fungal treatment.

## 2016-12-13 NOTE — Progress Notes (Signed)
Subjective:  Roy Webb is a 41 y.o. year old very pleasant male patient who presents for/with See problem oriented charting ROS- see ros embedded below   Past Medical History-  Patient Active Problem List   Diagnosis Date Noted  . 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 Bilateral hearing loss 10/21/2008    Priority: Low  . Allergic rhinitis 12/04/2006    Priority: Low  . Right cervical radiculopathy 06/15/2016    Medications- reviewed and updated Current Outpatient Prescriptions  Medication Sig Dispense Refill  . fluticasone (FLONASE) 50 MCG/ACT nasal spray 2 sprays by Nasal route as needed.       Objective: BP 102/72 (BP Location: Left Arm, Patient Position: Sitting, Cuff Size: Large)   Pulse 73   Temp 98.6 F (37 C) (Oral)   Ht 5' 7.25" (1.708 m)   Wt 148 lb 6.4 oz (67.3 kg)   SpO2 97%   BMI 23.07 kg/m  Gen: NAD, resting comfortably No mucus membrane involvement Skin: warm, dry, left ring finger with several erythematous areas with dry scale. Finger from web of finger to past the PIP joint is coarse/dry in texture. Some of these areas have epithelium removed- superficial wounds.   Assessment/Plan:  Rash S:2 months of rash on left ring finger. On and off. Started around his ring and seems to be expanding. Seems to get better and get worse in cycles. Hydrocortisone helps with the itch.  ROS-not ill appearing, no fever/chills. No new medications. Not immunocompromised. No mucus membrane involvement.  A/P: potential dyshidrotic ecczema. Will trial steroid cream x 10 days (see avs instructions with vaseline) with 1 week break with 1 more cycle. Discussed other options in differential diagnosis- if symptoms worsen would stop steroid and likely consider antifungal to start. We discussed potential bacterial element but minimal pain, warmth, erythema so thought less likely.   Orders Placed This Encounter  Procedures  . Flu Vaccine QUAD 36+ mos IM   Meds ordered this  encounter  Medications  . triamcinolone cream (KENALOG) 0.1 %    Sig: Apply 1 application topically 2 (two) times daily. For 7-10 days maximum    Dispense:  80 g    Refill:  0   Return precautions advised.  Garret Reddish, MD

## 2017-01-24 DIAGNOSIS — H6983 Other specified disorders of Eustachian tube, bilateral: Secondary | ICD-10-CM | POA: Diagnosis not present

## 2017-01-24 DIAGNOSIS — H6122 Impacted cerumen, left ear: Secondary | ICD-10-CM | POA: Diagnosis not present

## 2017-01-24 DIAGNOSIS — H9011 Conductive hearing loss, unilateral, right ear, with unrestricted hearing on the contralateral side: Secondary | ICD-10-CM | POA: Diagnosis not present

## 2017-03-14 DIAGNOSIS — H6983 Other specified disorders of Eustachian tube, bilateral: Secondary | ICD-10-CM | POA: Diagnosis not present

## 2017-03-14 DIAGNOSIS — R05 Cough: Secondary | ICD-10-CM | POA: Diagnosis not present

## 2017-03-14 DIAGNOSIS — K219 Gastro-esophageal reflux disease without esophagitis: Secondary | ICD-10-CM | POA: Diagnosis not present

## 2017-05-01 ENCOUNTER — Encounter: Payer: Self-pay | Admitting: Family Medicine

## 2017-05-01 ENCOUNTER — Ambulatory Visit: Payer: BLUE CROSS/BLUE SHIELD | Admitting: Family Medicine

## 2017-05-01 VITALS — BP 120/72 | HR 76 | Temp 98.3°F | Ht 67.5 in | Wt 149.0 lb

## 2017-05-01 DIAGNOSIS — R059 Cough, unspecified: Secondary | ICD-10-CM

## 2017-05-01 DIAGNOSIS — R05 Cough: Secondary | ICD-10-CM | POA: Diagnosis not present

## 2017-05-01 MED ORDER — METHYLPREDNISOLONE ACETATE 80 MG/ML IJ SUSP
80.0000 mg | Freq: Once | INTRAMUSCULAR | Status: AC
  Administered 2017-05-01: 80 mg via INTRAMUSCULAR

## 2017-05-01 MED ORDER — IPRATROPIUM BROMIDE 0.06 % NA SOLN: 2.0000 | Freq: Four times a day (QID) | NASAL | 0 refills | Status: DC

## 2017-05-01 MED ORDER — AZITHROMYCIN 250 MG PO TABS: ORAL_TABLET | ORAL | 0 refills | Status: DC

## 2017-05-01 NOTE — Patient Instructions (Signed)
Start the atrovent.  Start the zpack if your symptoms worsen or do not improve in a few days.  Please stay well hydrated.  You can take tylenol and/or motrin as needed for low grade fever and pain.  Please let me know if your symptoms worsen or fail to improve.  Take care, Dr Parker  

## 2017-05-01 NOTE — Progress Notes (Signed)
    Subjective:  Roy Webb is a 42 y.o. male who presents today for same-day appointment with a chief complaint of cough.   HPI:  Cough, Acute Issue Started 4 days ago. Worsened over that time. Associated symptoms include headache, facial pain, rhinorrhea, myalgias, and sore throat. No fevers or chills. Several sick contacts in his office. Tried OTC meds which have helped a small amount.   ROS: Per HPI  PMH: He reports that he quit smoking about 7 years ago. He has a 3.75 pack-year smoking history. He has quit using smokeless tobacco. He reports that he drinks alcohol. He reports that he does not use drugs.  Objective:  Physical Exam: BP 120/72 (BP Location: Left Arm, Patient Position: Sitting, Cuff Size: Normal)   Pulse 76   Temp 98.3 F (36.8 C) (Oral)   Ht 5' 7.5" (1.715 m)   Wt 149 lb (67.6 kg)   SpO2 96%   BMI 22.99 kg/m   Gen: NAD, resting comfortably HEENT: TMs are clear effusion bilaterally.  Nose mucosa erythematous with white discharge.  Maxillary sinuses with mildly decreased transillumination bilaterally.  Oropharynx slightly erythematous without exudate.  No lymphadenopathy. CV: RRR with no murmurs appreciated Pulm: NWOB, CTAB with no crackles, wheezes, or rhonchi  Assessment/Plan:  Cough Likely secondary to viral URI. No signs of bacterial infection. Start atrovent for rhinorrhea/sinus congestion. Will give 80mg  IM depo-medrol for sore throat. Sent in a "pocket prescription" for azithromycin with strict instruction to not start unless symptoms worse or fail to improve within the next several days. Recommended tylenol and/or motrin as needed for low grade fever and pain. Encouraged good oral hydration. Return precautions reviewed. Follow up as needed.    Algis Greenhouse. Jerline Pain, MD 05/01/2017 9:13 AM

## 2018-01-24 DIAGNOSIS — B181 Chronic viral hepatitis B without delta-agent: Secondary | ICD-10-CM | POA: Diagnosis not present

## 2018-01-24 DIAGNOSIS — K760 Fatty (change of) liver, not elsewhere classified: Secondary | ICD-10-CM | POA: Diagnosis not present

## 2018-01-24 DIAGNOSIS — Z23 Encounter for immunization: Secondary | ICD-10-CM | POA: Diagnosis not present

## 2018-01-24 DIAGNOSIS — L600 Ingrowing nail: Secondary | ICD-10-CM | POA: Diagnosis not present

## 2018-01-24 DIAGNOSIS — L03032 Cellulitis of left toe: Secondary | ICD-10-CM | POA: Diagnosis not present

## 2018-02-10 IMAGING — DX DG CERVICAL SPINE 2 OR 3 VIEWS
4 series · 4 of 4 positions shown · non-contrast
Comparison: None.

CLINICAL DATA: Right cervical radiculopathy.

EXAM:
CERVICAL SPINE - 2-3 VIEW

[cervical spine ap (1 of 2)]
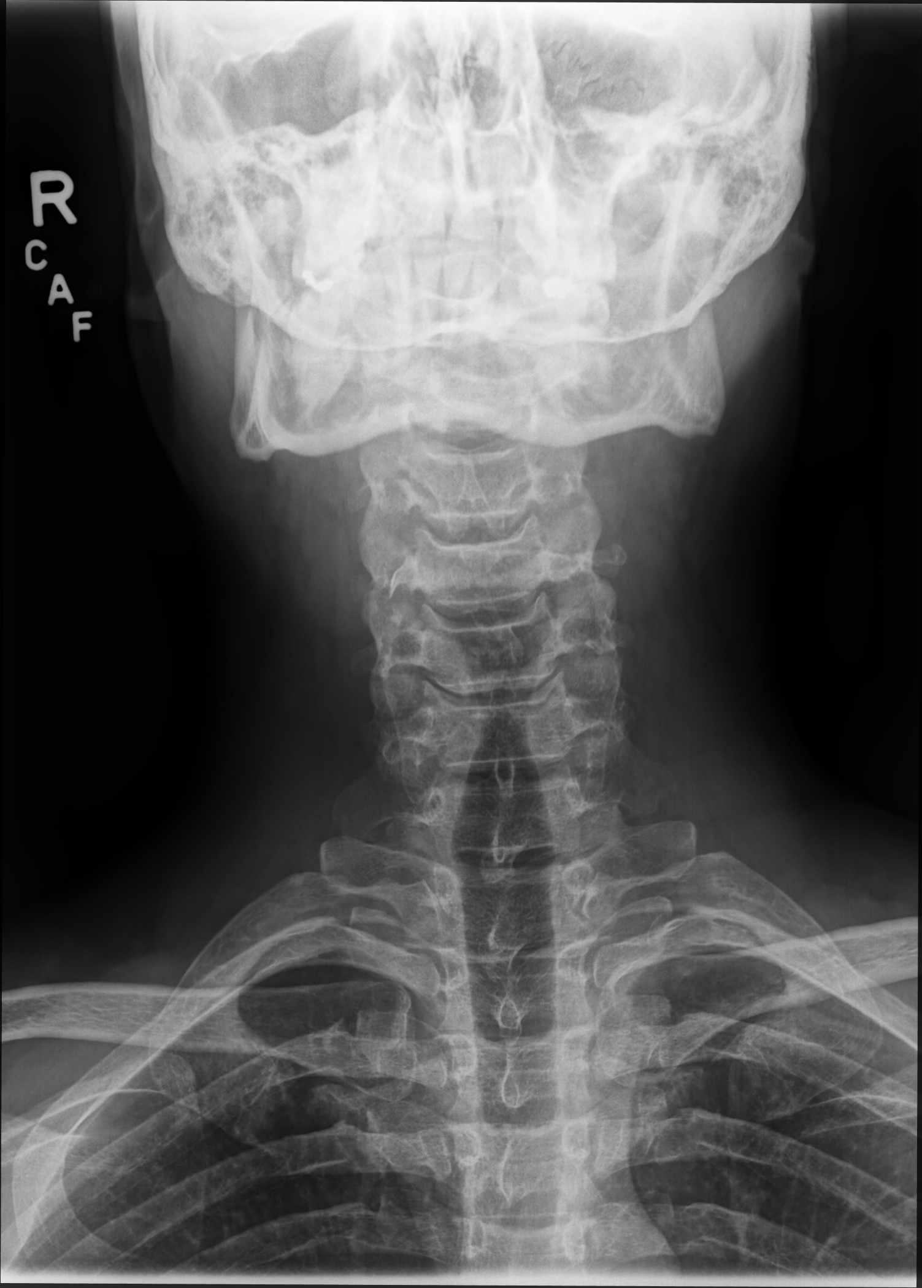

[cervical spine lat (1 of 2)]
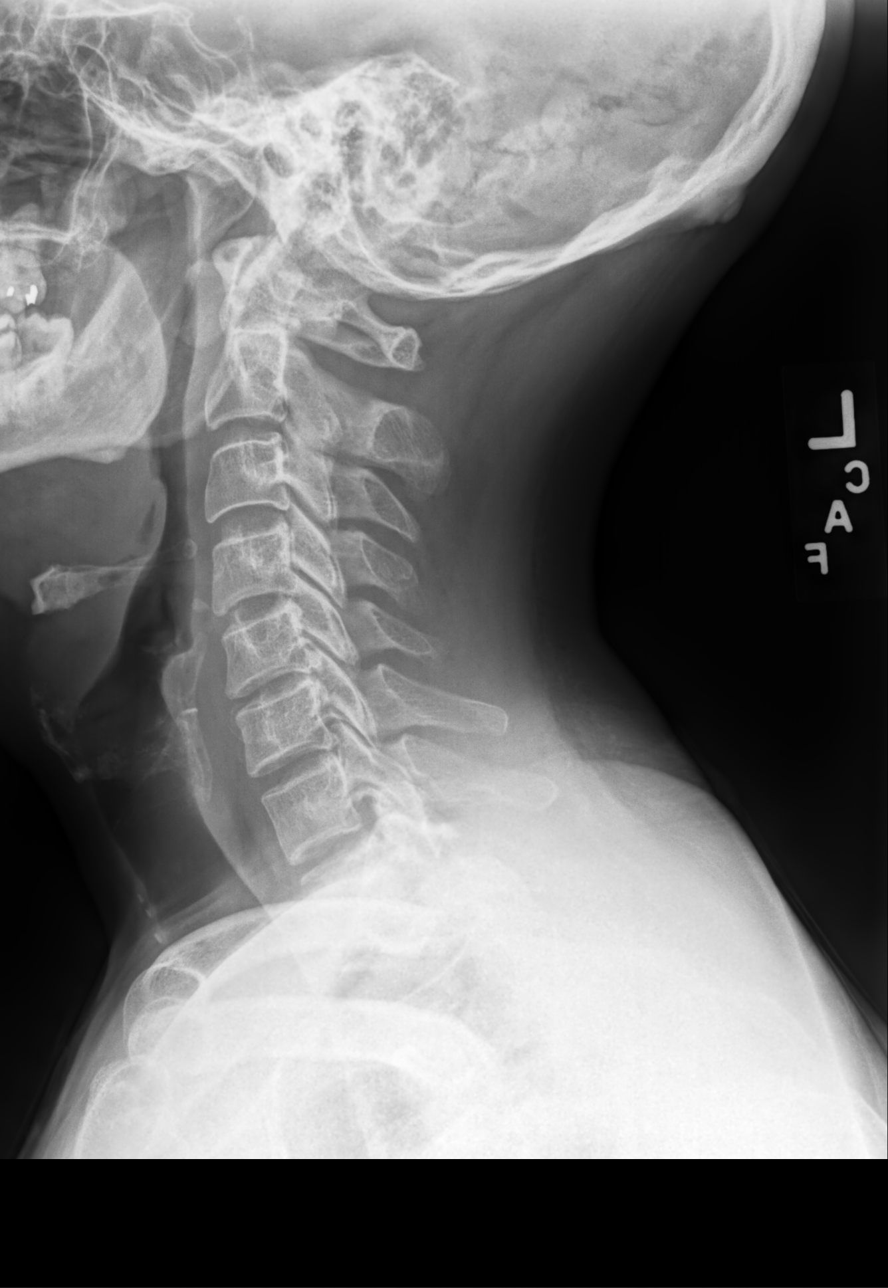

[cervical spine lat (2 of 2)]
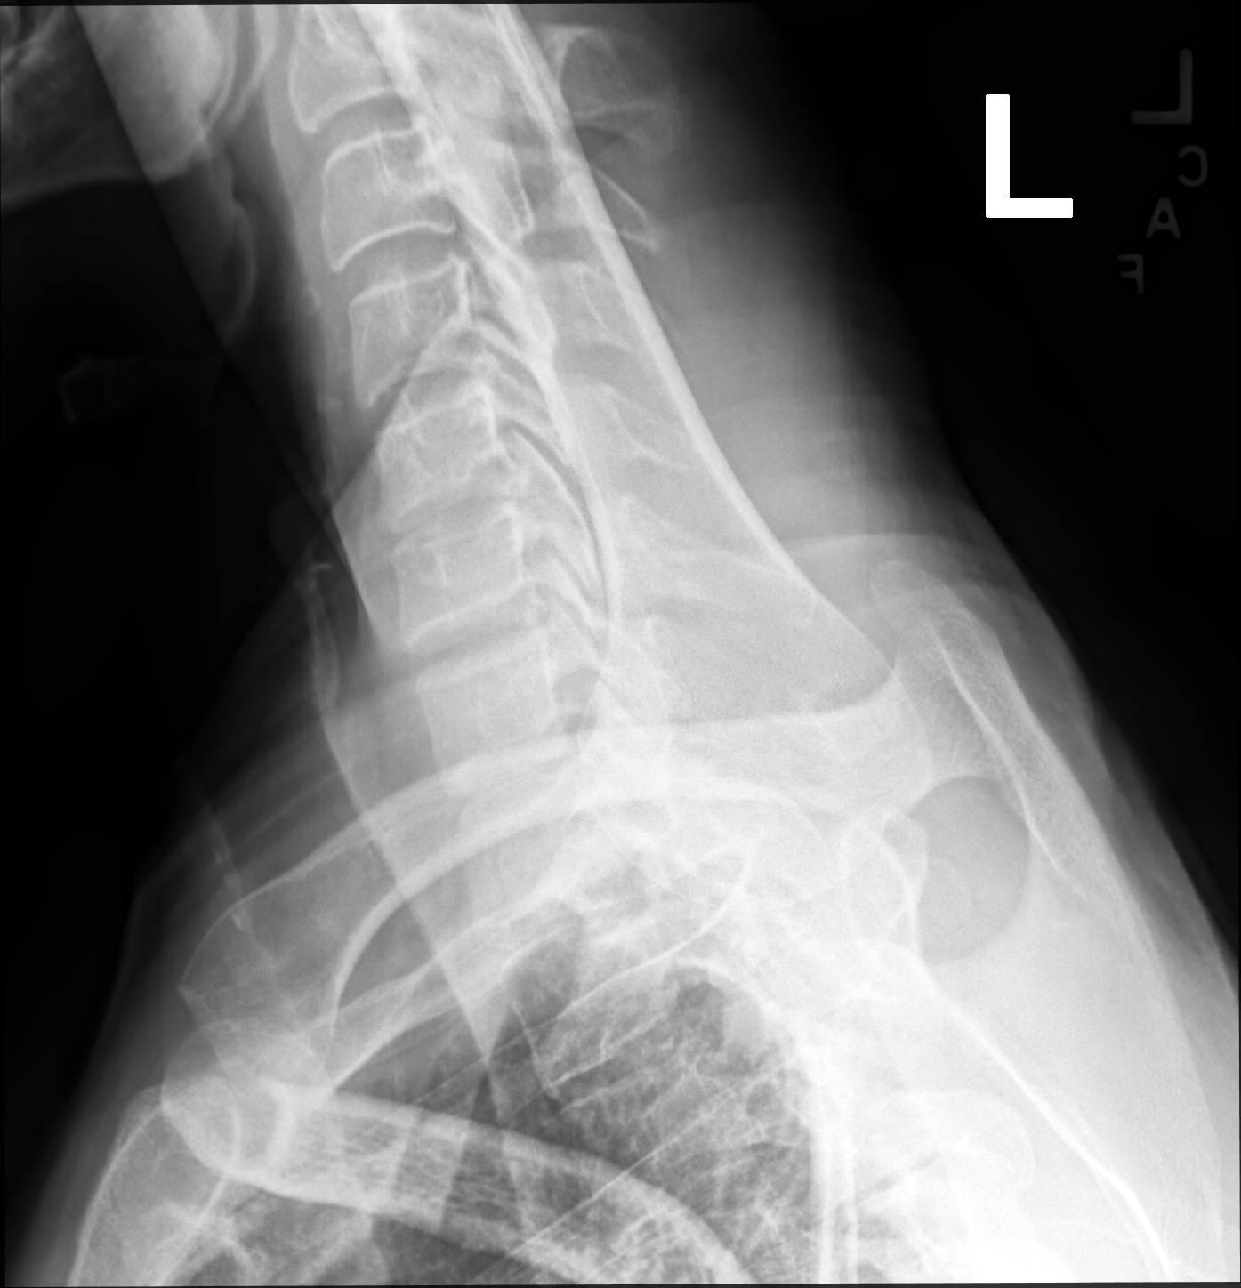

[cervical spine ap (2 of 2)]
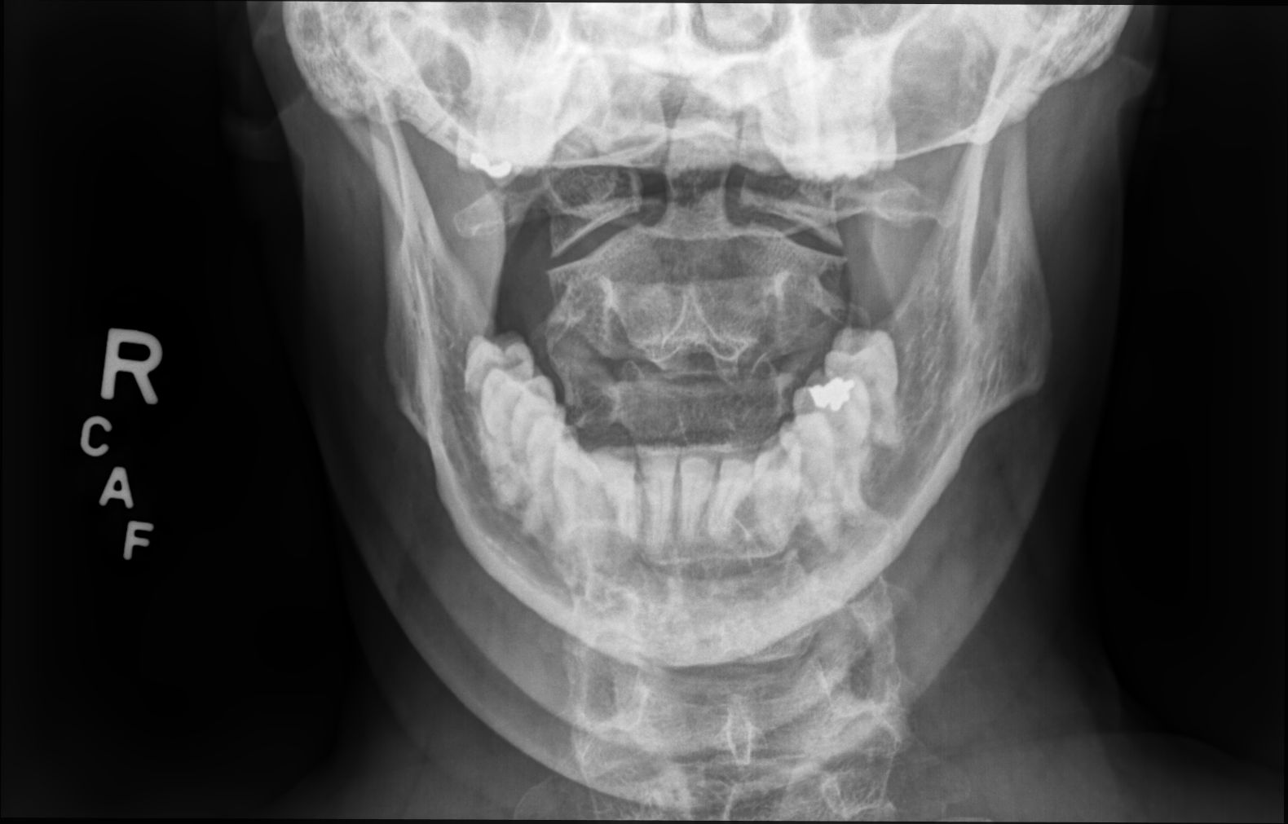

[4 of 4 positions shown; findings below may reference images not displayed]

FINDINGS: No fracture or spondylolisthesis is noted. Mild degenerative disc
disease is noted at C5-6. No prevertebral soft tissue swelling is
noted. Remaining disc spaces appear to be intact.
IMPRESSION: Mild degenerative disc disease at C5-6. No acute abnormality seen in
the cervical spine.

## 2018-02-13 DIAGNOSIS — J209 Acute bronchitis, unspecified: Secondary | ICD-10-CM | POA: Diagnosis not present

## 2018-02-18 ENCOUNTER — Ambulatory Visit: Payer: BLUE CROSS/BLUE SHIELD | Admitting: Family Medicine

## 2018-02-18 ENCOUNTER — Encounter: Payer: Self-pay | Admitting: Family Medicine

## 2018-02-18 VITALS — BP 110/72 | HR 79 | Temp 98.5°F | Ht 67.5 in | Wt 149.0 lb

## 2018-02-18 DIAGNOSIS — H1032 Unspecified acute conjunctivitis, left eye: Secondary | ICD-10-CM

## 2018-02-18 MED ORDER — ERYTHROMYCIN 5 MG/GM OP OINT: TOPICAL_OINTMENT | OPHTHALMIC | 0 refills | Status: DC

## 2018-02-18 NOTE — Patient Instructions (Addendum)
Health Maintenance Due  Topic Date Due  . HIV Screening- we can consider this with your next lab work 02/28/1993  . INFLUENZA VACCINE -will do at December visit 10/25/2017   You have conjunctivitis of your left eye.  This is most likely due to a virus-could also potentially be related to an allergic contact such as something you were cleaning with.  There is a very small chance this is a bacterial infection- we are going to cover you for that with erythromycin ointment particularly with you going out of town.  Use for next 5 to 7 days  Until the redness and discharge have resolved-I would still consider you contagious.  You are not contagious unless you touch her eye or someone touches your eye or you touch your eye and then touch something else.  The big thing here is to avoid touching her eye and to wash her hand anytime you touch her eye or anytime you think about it.  If you have redness around the skin around your eye, pain with eye movement, blurry vision, fever-please seek care immediately  Hope you have a fantastic trip to [place] you the best in your travels

## 2018-02-18 NOTE — Progress Notes (Signed)
Subjective:  Roy Webb is a 42 y.o. year old very pleasant male patient who presents for/with See problem oriented charting ROS-no blurry vision, no pain with eye movement, no fever or chills or nausea or vomiting  Past Medical History-  Patient Active Problem List   Diagnosis Date Noted  . 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 Bilateral hearing loss 10/21/2008    Priority: Low  . Allergic rhinitis 12/04/2006    Priority: Low  . Right cervical radiculopathy 06/15/2016    Medications- reviewed and updated Current Outpatient Medications  Medication Sig Dispense Refill  . fluticasone (FLONASE) 50 MCG/ACT nasal spray 2 sprays by Nasal route as needed.      . gabapentin (NEURONTIN) 300 MG capsule Take 300 mg by mouth 3 (three) times daily.  3   No current facility-administered medications for this visit.    Objective: BP 110/72 (BP Location: Left Arm, Patient Position: Sitting, Cuff Size: Large)   Pulse 79   Temp 98.5 F (36.9 C) (Oral)   Ht 5' 7.5" (1.715 m)   Wt 149 lb (67.6 kg)   SpO2 97%   BMI 22.99 kg/m  Gen: NAD, resting comfortably Right eye normal.  PERRLA.  Left eye conjunctiva markedly erythematous-clear discharge noted.  Extraocular movements intact without significant pain. CV: RRR no murmurs rubs or gallops Lungs: CTAB no crackles, wheeze, rhonchi Ext: no edema Skin: warm, dry streptococcus viridans uti treatment Assessment/Plan:   Acute conjunctivitis of left eye, unspecified acute conjunctivitis type  S: Patient woke up with slight puffiness under left eyelid.  He also noted some crusting on the inner eyelid.  Has had some clear drainage today.  Noted sclera of his eye was red.  People at work noticed in question whether he had [place].  He denies any pain.  Does have some mild itching.  No issues with the right eye.  No pain with eye movement A/P: Acute conjunctivitis in 42 year old male- could be viral or allergic-we will cover for bacterial given  upcoming travels and difficulty with close follow-up From AVS:  " You have conjunctivitis of your left eye.  This is most likely due to a virus-could also potentially be related to an allergic contact such as something you were cleaning with.  There is a very small chance this is a bacterial infection- we are going to cover you for that with erythromycin ointment particularly with you going out of town.  Use for next 5 to 7 days  Until the redness and discharge have resolved-I would still consider you contagious.  You are not contagious unless you touch your eye or someone touches your eye or you touch your eye and then touch something else.  The big thing here is to avoid touching your eye and to wash your hand anytime you touch your eye or anytime you think about it.  If you have redness around the skin around your eye, pain with eye movement, blurry vision, fever-please seek care immediately "  Future Appointments  Date Time Provider [place]  02/26/2018 10:15 AM Marin Olp, MD [facility]   Return precautions advised.  Garret Reddish, MD

## 2018-02-26 ENCOUNTER — Ambulatory Visit (INDEPENDENT_AMBULATORY_CARE_PROVIDER_SITE_OTHER): Payer: BLUE CROSS/BLUE SHIELD | Admitting: Family Medicine

## 2018-02-26 ENCOUNTER — Encounter: Payer: Self-pay | Admitting: Family Medicine

## 2018-02-26 VITALS — BP 112/78 | HR 75 | Temp 98.3°F | Ht 67.5 in | Wt 151.6 lb

## 2018-02-26 DIAGNOSIS — Z3009 Encounter for other general counseling and advice on contraception: Secondary | ICD-10-CM | POA: Diagnosis not present

## 2018-02-26 DIAGNOSIS — H1032 Unspecified acute conjunctivitis, left eye: Secondary | ICD-10-CM | POA: Diagnosis not present

## 2018-02-26 DIAGNOSIS — Z23 Encounter for immunization: Secondary | ICD-10-CM

## 2018-02-26 NOTE — Progress Notes (Signed)
Subjective:  Roy Webb is a 42 y.o. year old very pleasant male patient who presents for/with See problem oriented charting ROS- no blurry vision. No more discharge from eyes. Has noted slight skin colored to whitish bumps under eyes. No groin pain reported.    Past Medical History-  Patient Active Problem List   Diagnosis Date Noted  . 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 Bilateral hearing loss 10/21/2008    Priority: Low  . Allergic rhinitis 12/04/2006    Priority: Low  . Right cervical radiculopathy 06/15/2016    Medications- reviewed and updated Current Outpatient Medications  Medication Sig Dispense Refill  . erythromycin ophthalmic ointment One-half inch (1.25 cm) four times daily for 5 to 7 days to affected eye(s) 3.5 g 0  . fluticasone (FLONASE) 50 MCG/ACT nasal spray 2 sprays by Nasal route as needed.      . gabapentin (NEURONTIN) 300 MG capsule Take 300 mg by mouth 3 (three) times daily.  3   No current facility-administered medications for this visit.     Objective: BP 112/78 (BP Location: Left Arm, Patient Position: Sitting, Cuff Size: Large)   Pulse 75   Temp 98.3 F (36.8 C) (Oral)   Ht 5' 7.5" (1.715 m)   Wt 151 lb 9.6 oz (68.8 kg)   SpO2 97%   BMI 23.39 kg/m  Gen: NAD, resting comfortably Very mild erythema in bilateral conjunctiva- appears close to baseline. Faint scleral erythem ain left eye- no discharge. Right lowe eyelid two 1-2 mm flesh to white colored papules CV: RRR  Lungs: nonlabored, normal respiratory rate Abdomen: soft/nondistended Ext: no edema Skin: warm, dry  Assessment/Plan:  Conjunctivitis follow-up S: conjunctivitis treated last week with erythromycin in case bacterial. He after leaving noted a significant increase in green/yellow discharge. He started the ointment and noted improvement significantly by Saturday. He did note some discharge from right eye so to be on safe side used ointment in that eye.   Very mild lingering  redness but no discharge, no blurry vision, no itching.  A/P: Much improved today- given history of worsening symptoms after patient left I am thankful we went ahead and started erythromycin.  Has some small papules under her eyes- I am not sure if these are calcium deposits, early skin tags-we will continue to monitor  Vasectomy evaluation - Plan: Ambulatory referral to Urology  S: he originally scheduled this appointment  Son 52, daughter 6 months- prolonged labor and even with doula had another c section. Wife is firm doesn't want another child.  Patient is also firm that he does not want to have another child A/P: Counseling provided today-ultimately patient wanted to proceed forward with vasectomy evaluation-[facility]  Advised patient to schedule physical within 6 to 12 months-he did this before he left thankfully Future Appointments  Date Time Provider [place]  12/31/2018  2:20 PM Marin Olp, MD [facility]   Lab/Order associations: Vasectomy evaluation - Plan: Ambulatory referral to Urology  Acute conjunctivitis of left eye, unspecified acute conjunctivitis type  Need for prophylactic vaccination and inoculation against influenza - Plan: Flu Vaccine QUAD 36+ mos IM    Time Stamp The duration of face-to-face time during this visit was greater than 15 minutes. Greater than 50% of this time was spent in counseling, explanation of diagnosis, planning of further management, and/or coordination of care including discussion of contagiousness of conjunctivitis, discussion possibilities under eye lesions may be, discussion of skin  tag in left axilla, discussion of vasectomy decision .   Return precautions advised.  Garret Reddish, MD

## 2018-02-26 NOTE — Patient Instructions (Addendum)
We will call you within two weeks about your referral to urology to discuss vasectomy (to be honest may take a few months to get in). If you do not hear within 3 weeks, give [place] a call.   Can go ahead and schedule a physical for 6-12 months from now- we can update some bloodwork at that time  Glad the eye is better and now hearing about your symptoms im particularly glad you came in when you did last week

## 2018-03-11 DIAGNOSIS — R109 Unspecified abdominal pain: Secondary | ICD-10-CM | POA: Diagnosis not present

## 2018-03-11 DIAGNOSIS — K5900 Constipation, unspecified: Secondary | ICD-10-CM | POA: Diagnosis not present

## 2018-03-11 DIAGNOSIS — N939 Abnormal uterine and vaginal bleeding, unspecified: Secondary | ICD-10-CM | POA: Diagnosis not present

## 2018-03-11 DIAGNOSIS — N390 Urinary tract infection, site not specified: Secondary | ICD-10-CM | POA: Diagnosis not present

## 2018-04-11 DIAGNOSIS — Z3009 Encounter for other general counseling and advice on contraception: Secondary | ICD-10-CM | POA: Diagnosis not present

## 2018-05-30 DIAGNOSIS — Z302 Encounter for sterilization: Secondary | ICD-10-CM | POA: Diagnosis not present

## 2018-09-05 ENCOUNTER — Telehealth: Payer: Self-pay | Admitting: Family Medicine

## 2018-09-05 DIAGNOSIS — Z20828 Contact with and (suspected) exposure to other viral communicable diseases: Secondary | ICD-10-CM | POA: Diagnosis not present

## 2018-09-05 NOTE — Telephone Encounter (Signed)
Pt called with questions about COVID-19 testing. He and his family are taking a trip via private care to a different state. He was asking if they should/could be tested before traveling. Pt and all family members are not sick hand have no symptoms.  Care advice  for hand washing, mask, etc given to patient. Will route note to Dr Yong Channel

## 2018-09-06 NOTE — Telephone Encounter (Signed)
Last OV 02/26/18. OV to discuss?  Forwarding to Dr. Yong Channel to advise.

## 2018-09-07 NOTE — Telephone Encounter (Signed)
[facility] currently has guidelines for outpatient testing   "Criteria for [facility] Outpatient COVID-19 Testing.Patients in long-term care facilities with symptoms .Patients of any age with symptoms .Patients with underlying conditions with symptoms .First responders with symptoms .Asymptomatic patients from intrinsically risky/high density environments.Asymptomatic COVID-19 convalescent plasma donors  "  I dont think hes in any of these groups. He could call health department to see if he can be tested but I dont think they are screening asymptomatic folks without exposure either.   Tell him sorry about this.

## 2018-09-09 NOTE — Telephone Encounter (Signed)
Called and spoke with patient. He had test done over the weekend at [facility] and it came back negative.

## 2018-10-10 ENCOUNTER — Other Ambulatory Visit: Payer: Self-pay | Admitting: Family Medicine

## 2018-10-21 ENCOUNTER — Other Ambulatory Visit: Payer: Self-pay | Admitting: Family Medicine

## 2018-12-06 ENCOUNTER — Other Ambulatory Visit: Payer: Self-pay | Admitting: *Deleted

## 2018-12-06 ENCOUNTER — Encounter: Payer: Self-pay | Admitting: Physician Assistant

## 2018-12-06 ENCOUNTER — Ambulatory Visit (INDEPENDENT_AMBULATORY_CARE_PROVIDER_SITE_OTHER): Payer: BC Managed Care – PPO | Admitting: Physician Assistant

## 2018-12-06 VITALS — Ht 67.5 in | Wt 155.0 lb

## 2018-12-06 DIAGNOSIS — Z20822 Contact with and (suspected) exposure to covid-19: Secondary | ICD-10-CM

## 2018-12-06 DIAGNOSIS — R05 Cough: Secondary | ICD-10-CM | POA: Diagnosis not present

## 2018-12-06 DIAGNOSIS — R6889 Other general symptoms and signs: Secondary | ICD-10-CM | POA: Diagnosis not present

## 2018-12-06 DIAGNOSIS — R059 Cough, unspecified: Secondary | ICD-10-CM

## 2018-12-06 MED ORDER — AMOXICILLIN-POT CLAVULANATE 875-125 MG PO TABS: 1.0000 | ORAL_TABLET | Freq: Two times a day (BID) | ORAL | 0 refills | Status: DC

## 2018-12-06 NOTE — Progress Notes (Signed)
Virtual Visit via Video   I connected with Roy Webb on 12/06/18 at 11:20 AM EDT by a video enabled telemedicine application and verified that I am speaking with the correct person using two identifiers. Location patient: Home Location provider: [facility], Office Persons participating in the virtual visit: [place], Geckler PA-C.  I discussed the limitations of evaluation and management by telemedicine and the availability of in person appointments. The patient expressed understanding and agreed to proceed.  I acted as a Education administrator for [employer], [employer], LPN  Subjective:   HPI:   Sinus problem Pt c/o nasal congestion, sinus pressure, headaches. Clear nasal discharge. Slight cough. Started 2 -3 days ago. Wife was diagnosed with sinusitis 2 weeks ago, had negative COVID testing and daughter has been having some congestion too. Pt has tried Tylenol, OTC anti-histamine. Having some chills, lower back pain started last night. Denies urinary symptoms.  ROS: See pertinent positives and negatives per HPI.  Patient Active Problem List   Diagnosis Date Noted  . Right cervical radiculopathy 06/15/2016  . 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 Bilateral hearing loss 10/21/2008  . Allergic rhinitis 12/04/2006    Social History   Tobacco Use  . Smoking status: Former Smoker    Packs/day: 0.25    Years: 15.00    Pack years: 3.75    Quit date: 03/27/2010    Years since quitting: 8.7  . Smokeless tobacco: Former Network engineer Use Topics  . Alcohol use: Yes    Comment: social 7 a week    Current Outpatient Medications:  .  fluticasone (FLONASE) 50 MCG/ACT nasal spray, 2 sprays by Nasal route as needed.  , Disp: , Rfl:  .  triamcinolone cream (KENALOG) 0.1 %, APPLY TO SKIN 2 TIMES DAILY FOR 7 TO 10 DAYS, Disp: 80 g, Rfl: 0  No Known Allergies  Objective:   VITALS: Per patient if applicable, see vitals. GENERAL: Alert, appears well and in no acute distress.  HEENT: Atraumatic, conjunctiva clear, no obvious abnormalities on inspection of external nose and ears. NECK: Normal movements of the head and neck. CARDIOPULMONARY: No increased WOB. Speaking in clear sentences. I:E ratio WNL.  MS: Moves all visible extremities without noticeable abnormality. PSYCH: Pleasant and cooperative, well-groomed. Speech normal rate and rhythm. Affect is appropriate. Insight and judgement are appropriate. Attention is focused, linear, and appropriate.  NEURO: CN grossly intact. Oriented as arrived to appointment on time with no prompting. Moves both UE equally.  SKIN: No obvious lesions, wounds, erythema, or cyanosis noted on face or hands.  Assessment and Plan:   Tyrrell was seen today for sinus problem.  Diagnoses and all orders for this visit:  Cough -     Novel Coronavirus, NAA ([facility])   Patient has a respiratory illness without signs of acute distress or respiratory compromise at this time. This is likely a viral infection, which can come from a number of respiratory viruses. We are going to send patient for drive-up testing. As a precaution, they have been advised to remain home until COVID-19 results and then possible further quarantine after that based on results and symptoms. Advised if they experience a "second sickening" or worsening symptoms as the illness progresses, they are to call the office for further instructions or seek emergent evaluation for any severe symptoms.   I did give him a safety net prescription of Augmentin, and I recommended that he not start this until day 7 or later of his  illness.  Of course if symptoms change or worsen in the interim, he was advised that he could reach out and we could discuss taking sooner if needed.  . Reviewed expectations re: course of current medical issues. . Discussed self-management of symptoms. . Outlined signs and symptoms indicating need for more acute intervention. . Patient verbalized understanding  and all questions were answered. Marland Kitchen Health Maintenance issues including appropriate healthy diet, exercise, and smoking avoidance were discussed with patient. . See orders for this visit as documented in the electronic medical record.  I discussed the assessment and treatment plan with the patient. The patient was provided an opportunity to ask questions and all were answered. The patient agreed with the plan and demonstrated an understanding of the instructions.   The patient was advised to call back or seek an in-person evaluation if the symptoms worsen or if the condition fails to improve as anticipated.   CMA or LPN served as scribe during this visit. History, Physical, and Plan performed by medical provider. The above documentation has been reviewed and is accurate and complete.   [place], [place] 12/06/2018

## 2018-12-08 LAB — NOVEL CORONAVIRUS, NAA: SARS-CoV-2, NAA: NOT DETECTED
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
## 2018-12-30 NOTE — Patient Instructions (Addendum)
Health Maintenance Due  Topic Date Due  . HIV Screening - with labs 02/28/1993  . INFLUENZA VACCINE -today 10/26/2018   Sit tight for a few minutes as we get optho visit set up for you- Roy Webb will be in to help you and then you can go to lab after that.   No changes today.   Please stop by lab before you go If you do not have mychart- we will call you about results within 5 business days of [place] receiving them.  If you have mychart- we will send your results within 3 business days of [place] receiving them.  If abnormal or we want to clarify a result, we will call or mychart you to make sure you receive the message.  If you have questions or concerns or don't hear within 5-7 days, please send [place] a message or call us.

## 2018-12-31 ENCOUNTER — Other Ambulatory Visit: Payer: Self-pay

## 2018-12-31 ENCOUNTER — Encounter: Payer: Self-pay | Admitting: Family Medicine

## 2018-12-31 ENCOUNTER — Ambulatory Visit (INDEPENDENT_AMBULATORY_CARE_PROVIDER_SITE_OTHER): Payer: BC Managed Care – PPO | Admitting: Family Medicine

## 2018-12-31 VITALS — BP 112/80 | HR 73 | Temp 98.3°F | Ht 67.5 in | Wt 153.2 lb

## 2018-12-31 DIAGNOSIS — Z23 Encounter for immunization: Secondary | ICD-10-CM

## 2018-12-31 DIAGNOSIS — E785 Hyperlipidemia, unspecified: Secondary | ICD-10-CM | POA: Diagnosis not present

## 2018-12-31 DIAGNOSIS — Z0000 Encounter for general adult medical examination without abnormal findings: Secondary | ICD-10-CM | POA: Diagnosis not present

## 2018-12-31 DIAGNOSIS — H00014 Hordeolum externum left upper eyelid: Secondary | ICD-10-CM

## 2018-12-31 DIAGNOSIS — Z114 Encounter for screening for human immunodeficiency virus [HIV]: Secondary | ICD-10-CM | POA: Diagnosis not present

## 2018-12-31 LAB — POC URINALSYSI DIPSTICK (AUTOMATED)
Bilirubin, UA: NEGATIVE
Blood, UA: NEGATIVE
Glucose, UA: NEGATIVE
Ketones, UA: NEGATIVE
Leukocytes, UA: NEGATIVE
Nitrite, UA: NEGATIVE
Protein, UA: NEGATIVE
Spec Grav, UA: 1.025 (ref 1.010–1.025)
Urobilinogen, UA: 0.2 E.U./dL
pH, UA: 6 (ref 5.0–8.0)

## 2018-12-31 MED ORDER — FLUTICASONE PROPIONATE 50 MCG/ACT NA SUSP: 2.0000 | NASAL | 11 refills | Status: DC | PRN

## 2018-12-31 NOTE — Addendum Note (Signed)
Addended by: Francis Dowse T on: 12/31/2018 03:06 PM   Modules accepted: Orders

## 2019-01-01 ENCOUNTER — Telehealth: Payer: Self-pay

## 2019-01-01 DIAGNOSIS — H00024 Hordeolum internum left upper eyelid: Secondary | ICD-10-CM | POA: Diagnosis not present

## 2019-01-01 DIAGNOSIS — H0288B Meibomian gland dysfunction left eye, upper and lower eyelids: Secondary | ICD-10-CM | POA: Diagnosis not present

## 2019-01-01 DIAGNOSIS — H1045 Other chronic allergic conjunctivitis: Secondary | ICD-10-CM | POA: Diagnosis not present

## 2019-01-01 DIAGNOSIS — H0288A Meibomian gland dysfunction right eye, upper and lower eyelids: Secondary | ICD-10-CM | POA: Diagnosis not present

## 2019-01-01 LAB — COMPREHENSIVE METABOLIC PANEL
ALT: 14 U/L (ref 0–53)
AST: 16 U/L (ref 0–37)
Albumin: 4.5 g/dL (ref 3.5–5.2)
Alkaline Phosphatase: 49 U/L (ref 39–117)
BUN: 15 mg/dL (ref 6–23)
CO2: 30 mEq/L (ref 19–32)
Calcium: 9.8 mg/dL (ref 8.4–10.5)
Chloride: 101 mEq/L (ref 96–112)
Creatinine, Ser: 0.87 mg/dL (ref 0.40–1.50)
GFR: 96.78 mL/min (ref >60.00)
Glucose, Bld: 74 mg/dL (ref 70–99)
Potassium: 4.7 mEq/L (ref 3.5–5.1)
Sodium: 139 mEq/L (ref 135–145)
Total Bilirubin: 0.5 mg/dL (ref 0.2–1.2)
Total Protein: 7.2 g/dL (ref 6.0–8.3)

## 2019-01-01 LAB — LIPID PANEL
Cholesterol: 199 mg/dL (ref 0–200)
HDL: 54.6 mg/dL (ref >39.00)
LDL Cholesterol: 116 mg/dL — ABNORMAL HIGH (ref 0–99)
NonHDL: 144.48
Total CHOL/HDL Ratio: 4
Triglycerides: 143 mg/dL (ref 0.0–149.0)
VLDL: 28.6 mg/dL (ref 0.0–40.0)

## 2019-01-01 LAB — CBC WITH DIFFERENTIAL/PLATELET
Basophils Absolute: 0.1 10*3/uL (ref 0.0–0.1)
Basophils Relative: 1.1 % (ref 0.0–3.0)
Eosinophils Absolute: 0.1 10*3/uL (ref 0.0–0.7)
Eosinophils Relative: 1.8 % (ref 0.0–5.0)
HCT: 43.6 % (ref 39.0–52.0)
Hemoglobin: 14.8 g/dL (ref 13.0–17.0)
Lymphocytes Relative: 30.9 % (ref 12.0–46.0)
Lymphs Abs: 1.6 10*3/uL (ref 0.7–4.0)
MCHC: 34 g/dL (ref 30.0–36.0)
MCV: 95.1 fl (ref 78.0–100.0)
Monocytes Absolute: 0.6 10*3/uL (ref 0.1–1.0)
Monocytes Relative: 12.2 % — ABNORMAL HIGH (ref 3.0–12.0)
Neutro Abs: 2.8 10*3/uL (ref 1.4–7.7)
Neutrophils Relative %: 54 % (ref 43.0–77.0)
Platelets: 302 10*3/uL (ref 150.0–400.0)
RBC: 4.59 Mil/uL (ref 4.22–5.81)
RDW: 13.1 % (ref 11.5–15.5)
WBC: 5.2 10*3/uL (ref 4.0–10.5)

## 2019-01-01 LAB — HIV ANTIBODY (ROUTINE TESTING W REFLEX): HIV 1&2 Ab, 4th Generation: NONREACTIVE

## 2019-01-01 NOTE — Telephone Encounter (Signed)
Copied from [place] [phone]. Topic: General - Other >> Jan 01, 2019  1:27 PM Yvette Rack wrote: Reason for CRM: Barnett Applebaum with [facility] called for clarification on the Rx for fluticasone (FLONASE) 50 MCG/ACT nasal spray

## 2019-01-01 NOTE — Telephone Encounter (Signed)
Notes faxed to [facility].

## 2019-01-01 NOTE — Telephone Encounter (Signed)
Returned the call to [facility] and confirmed the Rx question.

## 2019-01-01 NOTE — Telephone Encounter (Signed)
Copied from [place] [phone]. Topic: General - Inquiry >> Jan 01, 2019  8:25 AM Richardo Priest, NT wrote: Reason for CRM: [facility]ot [facility]les called in stating they would like to have the last office notes on this patient faxed over to them so they review the case for referral. Fax number is [phone]. Please advise.

## 2019-01-20 DIAGNOSIS — H00024 Hordeolum internum left upper eyelid: Secondary | ICD-10-CM | POA: Diagnosis not present

## 2019-04-30 DIAGNOSIS — F321 Major depressive disorder, single episode, moderate: Secondary | ICD-10-CM | POA: Diagnosis not present

## 2019-04-30 DIAGNOSIS — B1910 Unspecified viral hepatitis B without hepatic coma: Secondary | ICD-10-CM | POA: Diagnosis not present

## 2019-04-30 DIAGNOSIS — F1010 Alcohol abuse, uncomplicated: Secondary | ICD-10-CM | POA: Diagnosis not present

## 2019-05-14 DIAGNOSIS — F321 Major depressive disorder, single episode, moderate: Secondary | ICD-10-CM | POA: Diagnosis not present

## 2019-05-25 ENCOUNTER — Telehealth: Payer: BC Managed Care – PPO | Admitting: Emergency Medicine

## 2019-05-25 DIAGNOSIS — J069 Acute upper respiratory infection, unspecified: Secondary | ICD-10-CM | POA: Diagnosis not present

## 2019-05-25 MED ORDER — BENZONATATE 100 MG PO CAPS: 100.0000 mg | ORAL_CAPSULE | Freq: Three times a day (TID) | ORAL | 0 refills | Status: DC | PRN

## 2019-05-25 MED ORDER — METHYLPREDNISOLONE 4 MG PO TBPK: ORAL_TABLET | ORAL | 0 refills | Status: DC

## 2019-05-25 NOTE — Addendum Note (Signed)
Addended by: Margarita Mail on: 05/25/2019 02:26 PM   Modules accepted: Orders

## 2019-05-25 NOTE — Progress Notes (Signed)
E-Visit for Corona Virus Screening  Your current symptoms could be consistent with the coronavirus.  Many health care providers can now test patients at their office but not all are.  [place] has multiple testing sites. For information on our [place] testing locations and hours go to [email or web address]  We are enrolling you in our [place] for [place] . Daily you will receive a questionnaire within the [place] website. Our COVID 19 response team will be monitoring your responses daily.   Based on your history and questionnaire I will be prescribing a cough suppressant and a course of oral prednisone to reduce the inflammation in your throat, sinuses, and lungs. I would also recommend coughing and spitting out any expectorated sputum and phlegm. You may used over the counter expectorant and decongestants (guiafenasin and phenylephrine).    Testing Information: The COVID-19 Community Testing sites will begin testing BY APPOINTMENT ONLY.  You can schedule online at [email or web address]  If you do not have access to a smart phone or computer you may call [phone] for an appointment.   Additional testing sites in the Community:  . For [facility] Testing sites in [facility]  [email or web address]  . For Pop-up testing sites in [place]  [email or web address].[email or web address]  . For Testing sites with regular hours [email or web address]  . For [place] [email or web address]  . For [facility] [email or web address]  . For [facility] testing in [place] and [employer] [email or web address]  . For [facility] testing  in [facility]   [email or web address]  For  more information about community testing call [phone]   Please quarantine yourself while awaiting your test results. Please stay home for a minimum of 10 days from the first day of illness with improving symptoms and you have had 24 hours of no fever (without the use of Tylenol (Acetaminophen) Motrin (Ibuprofen) or any fever reducing medication).  Also - Do not get tested prior to returning to work because once you have had a positive test the test can stay positive for more then a month in some cases.   You should wear a mask or cloth face covering over your nose and mouth if you must be around other people or animals, including pets (even at home). Try to stay at least 6 feet away from other people. This will protect the people around you.  Please continue good preventive care measures, including:  frequent hand-washing, avoid touching your face, cover coughs/sneezes, stay out of crowds and keep a 6 foot distance from others.  COVID-19 is a respiratory illness with symptoms that are similar to the flu. Symptoms are typically mild to moderate, but there have been cases of severe illness and death due to the virus.   The following symptoms may appear 2-14 days after exposure: . Fever . Cough . Shortness of breath or difficulty breathing . Chills . Repeated shaking with chills . Muscle pain . Headache . Sore throat . New loss of taste or smell . Fatigue . Congestion or runny nose . Nausea or vomiting . Diarrhea  Go to the nearest hospital ED for assessment if fever/cough/breathlessness are severe or illness seems like a threat to life.  It is vitally important that if you feel that you have an infection such as this virus or any other virus that you stay home and away from places where you may spread it to others.  You should avoid contact with people age 19 and older.     You may also  take acetaminophen (Tylenol) as needed for  fever.  Reduce your risk of any infection by using the same precautions used for avoiding the common cold or flu:  Marland Kitchen Wash your hands often with soap and warm water for at least 20 seconds.  If soap and water are not readily available, use an alcohol-based hand sanitizer with at least 60% alcohol.  . If coughing or sneezing, cover your mouth and nose by coughing or sneezing into the elbow areas of your shirt or coat, into a tissue or into your sleeve (not your hands). . Avoid shaking hands with others and consider head nods or verbal greetings only. . Avoid touching your eyes, nose, or mouth with unwashed hands.  . Avoid close contact with people who are sick. . Avoid places or events with large numbers of people in one location, like concerts or sporting events. . Carefully consider travel plans you have or are making. . If you are planning any travel outside or inside the [place], visit the CDC's Travelers' Health webpage for the latest health notices. . If you have some symptoms but not all symptoms, continue to monitor at home and seek medical attention if your symptoms worsen. . If you are having a medical emergency, call 911.  HOME CARE . Only take medications as instructed by your medical team. . Drink plenty of fluids and get plenty of rest. . A steam or ultrasonic humidifier can help if you have congestion.   GET HELP RIGHT AWAY IF YOU HAVE EMERGENCY WARNING SIGNS** FOR COVID-19. If you or someone is showing any of these signs seek emergency medical care immediately. Call 911 or proceed to your closest emergency facility if: . You develop worsening high fever. . Trouble breathing . Bluish lips or face . Persistent pain or pressure in the chest . New confusion . Inability to wake or stay awake . You cough up blood. . Your symptoms become more severe  **This list is not all possible symptoms. Contact your medical provider for any symptoms that are sever or concerning to you.  MAKE  SURE YOU   Understand these instructions.  Will watch your condition.  Will get help right away if you are not doing well or get worse.  Your e-visit answers were reviewed by a board certified advanced clinical practitioner to complete your personal care plan.  Depending on the condition, your plan could have included both over the counter or prescription medications.  If there is a problem please reply once you have received a response from your provider.  Your safety is important to [place].  If you have drug allergies check your prescription carefully.    You can use MyChart to ask questions about today's visit, request a non-urgent call back, or ask for a work or school excuse for 24 hours related to this e-Visit. If it has been greater than 24 hours you will need to follow up with your provider, or enter a new e-Visit to address those concerns. You will get an e-mail in the next two days asking about your experience.  I hope that your e-visit has been valuable and will speed your recovery. Thank you for using e-visits.  Greater than 5 but less than 10 minutes spent researching, coordinating, and implementing care for this patient today

## 2019-05-26 ENCOUNTER — Ambulatory Visit: Payer: BC Managed Care – PPO | Attending: Internal Medicine

## 2019-05-26 DIAGNOSIS — Z20822 Contact with and (suspected) exposure to covid-19: Secondary | ICD-10-CM

## 2019-05-27 LAB — NOVEL CORONAVIRUS, NAA: SARS-CoV-2, NAA: NOT DETECTED

## 2019-06-01 DIAGNOSIS — H6690 Otitis media, unspecified, unspecified ear: Secondary | ICD-10-CM | POA: Diagnosis not present

## 2019-06-03 ENCOUNTER — Telehealth: Payer: BC Managed Care – PPO | Admitting: Physician Assistant

## 2019-06-03 DIAGNOSIS — R112 Nausea with vomiting, unspecified: Secondary | ICD-10-CM

## 2019-06-03 MED ORDER — ONDANSETRON 4 MG PO TBDP: ORAL_TABLET | ORAL | 0 refills | Status: DC

## 2019-06-03 NOTE — Progress Notes (Signed)
We are sorry that you are not feeling well. Here is how we plan to help!  Based on what you have shared with me it looks like you have a Virus that is irritating your GI tract. There is some chance this could be food poisoning as well, but initial treatments are the same. Vomiting is the forceful emptying of a portion of the stomach's content through the mouth.  Although nausea and vomiting can make you feel miserable, it's important to remember that these are not diseases, but rather symptoms of an underlying illness.  When we treat short term symptoms, we always caution that any symptoms that persist should be fully evaluated in a medical office.  I have prescribed a medication that will help alleviate your symptoms and allow you to stay hydrated:  Zofran 4 mg 1 tablet every 8 hours as needed for nausea and vomiting  HOME CARE:  Drink clear liquids.  This is very important! Dehydration (the lack of fluid) can lead to a serious complication.  Start off with 1 tablespoon every 5 minutes for 8 hours.  You may begin eating bland foods after 8 hours without vomiting.  Start with saltine crackers, white bread, rice, mashed potatoes, applesauce.  After 48 hours on a bland diet, you may resume a normal diet.  Try to go to sleep.  Sleep often empties the stomach and relieves the need to vomit.  GET HELP RIGHT AWAY IF:   Your symptoms do not improve or worsen within 2 days after treatment.  You have a fever for over 3 days.  You cannot keep down fluids after trying the medication.  MAKE SURE YOU:   Understand these instructions.  Will watch your condition.  Will get help right away if you are not doing well or get worse.   Thank you for choosing an e-visit. Your e-visit answers were reviewed by a board certified advanced clinical practitioner to complete your personal care plan. Depending upon the condition, your plan could have included both over the counter or prescription  medications. Please review your pharmacy choice. Be sure that the pharmacy you have chosen is open so that you can pick up your prescription now.  If there is a problem you may message your provider in [place] to have the prescription routed to another pharmacy. Your safety is important to [place]. If you have drug allergies check your prescription carefully.  For the next 24 hours, you can use MyChart to ask questions about today's visit, request a non-urgent call back, or ask for a work or school excuse from your e-visit provider. You will get an e-mail in the next two days asking about your experience. I hope that your e-visit has been valuable and will speed your recovery.   Greater than 5 minutes, yet less than 10 minutes of time have been spent researching, coordinating, and implementing care for this patient today

## 2019-06-13 DIAGNOSIS — Z13220 Encounter for screening for lipoid disorders: Secondary | ICD-10-CM | POA: Diagnosis not present

## 2019-06-13 DIAGNOSIS — Z0000 Encounter for general adult medical examination without abnormal findings: Secondary | ICD-10-CM | POA: Diagnosis not present

## 2019-06-13 DIAGNOSIS — F321 Major depressive disorder, single episode, moderate: Secondary | ICD-10-CM | POA: Diagnosis not present

## 2019-06-13 DIAGNOSIS — R5383 Other fatigue: Secondary | ICD-10-CM | POA: Diagnosis not present

## 2019-06-13 DIAGNOSIS — O99810 Abnormal glucose complicating pregnancy: Secondary | ICD-10-CM | POA: Diagnosis not present

## 2019-09-15 DIAGNOSIS — L02219 Cutaneous abscess of trunk, unspecified: Secondary | ICD-10-CM | POA: Diagnosis not present

## 2019-09-15 DIAGNOSIS — E663 Overweight: Secondary | ICD-10-CM | POA: Diagnosis not present

## 2019-12-16 ENCOUNTER — Ambulatory Visit: Payer: BC Managed Care – PPO | Admitting: Family Medicine

## 2020-03-29 ENCOUNTER — Encounter: Payer: Self-pay | Admitting: Family Medicine

## 2020-03-30 ENCOUNTER — Other Ambulatory Visit: Payer: Self-pay | Admitting: Family Medicine

## 2020-03-30 MED ORDER — PREDNISONE 20 MG PO TABS: ORAL_TABLET | ORAL | 0 refills | Status: DC

## 2020-04-06 ENCOUNTER — Other Ambulatory Visit: Payer: Self-pay

## 2020-04-06 DIAGNOSIS — G8929 Other chronic pain: Secondary | ICD-10-CM

## 2020-04-06 DIAGNOSIS — M542 Cervicalgia: Secondary | ICD-10-CM

## 2020-04-07 ENCOUNTER — Other Ambulatory Visit: Payer: Self-pay

## 2020-04-07 ENCOUNTER — Ambulatory Visit: Payer: BC Managed Care – PPO | Admitting: Family Medicine

## 2020-04-07 ENCOUNTER — Ambulatory Visit (INDEPENDENT_AMBULATORY_CARE_PROVIDER_SITE_OTHER): Payer: BC Managed Care – PPO

## 2020-04-07 ENCOUNTER — Encounter: Payer: Self-pay | Admitting: Family Medicine

## 2020-04-07 DIAGNOSIS — M5412 Radiculopathy, cervical region: Secondary | ICD-10-CM

## 2020-04-07 DIAGNOSIS — M542 Cervicalgia: Secondary | ICD-10-CM | POA: Diagnosis not present

## 2020-04-07 MED ORDER — GABAPENTIN 300 MG PO CAPS: 300.0000 mg | ORAL_CAPSULE | Freq: Three times a day (TID) | ORAL | 3 refills | Status: DC | PRN

## 2020-04-07 NOTE — Progress Notes (Signed)
X-ray cervical spine shows some mild arthritis at C5-C6 which is about the same as it was.

## 2020-04-07 NOTE — Patient Instructions (Addendum)
Thank you for coming in today.  I've referred you to Physical Therapy.  Let us know if you don't hear from them in one week.  Try the gabapentin.   Try heating pad.   Try TENS unit.   TENS UNIT: This is helpful for muscle pain and spasm.   Search and Purchase a TENS 7000 2nd edition at  www.tenspros.com or www.[email or web address].com It should be less than $30.     TENS unit instructions: Do not shower or bathe with the unit on . Turn the unit off before removing electrodes or batteries . If the electrodes lose stickiness add a drop of water to the electrodes after they are disconnected from the unit and place on plastic sheet. If you continued to have difficulty, call the TENS unit company to purchase more electrodes. . Do not apply lotion on the skin area prior to use. Make sure the skin is clean and dry as this will help prolong the life of the electrodes. . After use, always check skin for unusual red areas, rash or other skin difficulties. If there are any skin problems, does not apply electrodes to the same area. . Never remove the electrodes from the unit by pulling the wires. . Do not use the TENS unit or electrodes other than as directed. . Do not change electrode placement without consultating your therapist or physician. Marland Kitchen Keep 2 fingers with between each electrode. . Wear time ratio is 2:1, on to off times.    For example on for 30 minutes off for 15 minutes and then on for 30 minutes off for 15 minutes     Cervical Radiculopathy  Cervical radiculopathy happens when a nerve in the neck (a cervical nerve) is pinched or bruised. This condition can happen because of an injury to the cervical spine (vertebrae) in the neck, or as part of the normal aging process. Pressure on the cervical nerves can cause pain or numbness that travels from the neck all the way down into the arm and fingers. Usually, this condition gets better with rest. Treatment may be needed if the condition does not  improve. What are the causes? This condition may be caused by: A neck injury. A bulging (herniated) disk. Muscle spasms. Muscle tightness in the neck because of overuse. Arthritis. Breakdown or degeneration in the bones and joints of the spine (spondylosis) due to aging. Bone spurs that may develop near the cervical nerves. What are the signs or symptoms? Symptoms of this condition include: Pain. The pain may travel from the neck to the arm and hand. The pain can be severe or irritating. It may be worse when you move your neck. Numbness or tingling in your arm or hand. Weakness in the affected arm and hand, in severe cases. How is this diagnosed? This condition may be diagnosed based on your symptoms, your medical history, and a physical exam. You may also have tests, including: X-rays. A CT scan. An MRI. An electromyogram (EMG). Nerve conduction tests. How is this treated? In many cases, treatment is not needed for this condition. With rest, the condition usually gets better over time. If treatment is needed, options may include: Wearing a soft neck collar (cervical collar) for short periods of time, as told by your health care provider. Doing physical therapy to strengthen your neck muscles. Taking medicines, such as NSAIDs or oral corticosteroids. Having spinal injections, in severe cases. Having surgery. This may be needed if other treatments do not help. Different types of  surgery may be done depending on the cause of this condition. Follow these instructions at home: If you have a cervical collar: Wear it as told by your health care provider. Remove it only as told by your health care provider. Ask your health care provider if you can remove the collar for cleaning and bathing. If you are allowed to remove the collar for cleaning or bathing: Follow instructions from your health care provider about how to remove the collar safely. Clean the collar by wiping it with mild soap  and water and drying it completely. Take out any removable pads in the collar every 1-2 days, and wash them by hand with soap and water. Let them air-dry completely before you put them back in the collar. Check your skin under the collar for irritation or sores. If you see any, tell your health care provider. Managing pain Take over-the-counter and prescription medicines only as told by your health care provider. If directed, put ice on the affected area. If you have a soft neck collar, remove it as told by your health care provider. Put ice in a plastic bag. Place a towel between your skin and the bag. Leave the ice on for 20 minutes, 2-3 times a day. If applying ice does not help, you can try using heat. Use the heat source that your health care provider recommends, such as a moist heat pack or a heating pad. Place a towel between your skin and the heat source. Leave the heat on for 20-30 minutes. Remove the heat if your skin turns bright red. This is especially important if you are unable to feel pain, heat, or cold. You may have a greater risk of getting burned. Try a gentle neck and shoulder massage to help relieve symptoms.      Activity Rest as needed. Return to your normal activities as told by your health care provider. Ask your health care provider what activities are safe for you. Do stretching and strengthening exercises as told by your health care provider or physical therapist. Do not lift anything that is heavier than 10 lb (4.5 kg) until your health care provider tells you that it is safe. General instructions Use a flat pillow when you sleep. Do not drive while wearing a cervical collar. If you do not have a cervical collar, ask your health care provider if it is safe to drive while your neck heals. Ask your health care provider if the medicine prescribed to you requires you to avoid driving or using heavy machinery. Do not use any products that contain nicotine or tobacco,  such as cigarettes, e-cigarettes, and chewing tobacco. These can delay healing. If you need help quitting, ask your health care provider. Keep all follow-up visits as told by your health care provider. This is important. Contact a health care provider if: Your condition does not improve with treatment. Get help right away if: Your pain gets much worse and cannot be controlled with medicines. You have weakness or numbness in your hand, arm, face, or leg. You have a high fever. You have a stiff, rigid neck. You lose control of your bowels or your bladder (have incontinence). You have trouble with walking, balance, or speaking. Summary Cervical radiculopathy happens when a nerve in the neck is pinched or bruised. A nerve can get pinched from a bulging disk, arthritis, muscle spasms, or an injury to the neck. Symptoms include pain, tingling, or numbness radiating from the neck into the arm or hand. Weakness  can also occur in severe cases. Treatment may include rest, wearing a cervical collar, and physical therapy. Medicines may be prescribed to help with pain. In severe cases, injections or surgery may be needed. This information is not intended to replace advice given to you by your health care provider. Make sure you discuss any questions you have with your health care provider. Document Revised: 02/01/2018 Document Reviewed: 02/01/2018 Elsevier Patient Education  2021 [employer].

## 2020-04-07 NOTE — Progress Notes (Signed)
Subjective:    I'm seeing this patient as a consultation for:  Dr. Yong Channel. Note will be routed back to referring provider/PCP.  CC: Neck and R shoulder  I, Wendy Poet, LAT, ATC, am serving as scribe for Dr. Lynne Leader.  HPI: Pt is a 45 y/o male presenting w/ acute-on-chronic neck, upper back and R shoulder pain that began in late Dec 2021 and has progressively worsening. Pt reports his child was having trouble sleeping, so he has been sleeping in the same bed as child and getting kicked in the head throughout the night. 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 Pt was seen by PCP in March 2018 for similar pain. He currently locates his pain to R side c-spine and into t-spine. Pt c/o constant pain that burns and tinglings down his R arm.  Radiating pain: yes down his R UE to  R UE numbness/tingling: yes Aggravating factors: worse in the morning Treatments tried: heat; massage; stretching; prednisone dose pack; Excedrin  Diagnostic testing: C-spine XR- 06/15/16  Past medical history, Surgical history, Family history, Social history, Allergies, and medications have been entered into the medical record, reviewed.   Review of Systems: No new headache, visual changes, nausea, vomiting, diarrhea, constipation, dizziness, abdominal pain, skin rash, fevers, chills, night sweats, weight loss, swollen lymph nodes, body aches, joint swelling, muscle aches, chest pain, shortness of breath, mood changes, visual or auditory hallucinations.   Objective:    Vitals:   04/07/20 0831  BP: 110/78  Pulse: 99  SpO2: 96%   General: Well Developed, well nourished, and in no acute distress.  Neuro/Psych: Alert and oriented x3, extra-ocular muscles intact, able to move all 4 extremities, sensation grossly intact. Skin: Warm and dry, no rashes noted.  Respiratory: Not using accessory muscles, speaking in full  sentences, trachea midline.  Cardiovascular: Pulses palpable, no extremity edema. Abdomen: Does not appear distended. MSK: C-spine normal-appearing Nontender midline. Tender palpation right cervical paraspinal musculature and trapezius. Normal cervical motion. Positive right-sided Spurling's test. Upper extremity strength reflexes and sensation are equal and intact bilaterally. Pulses intact upper extremities  Lab and Radiology Results No results found for this or any previous visit (from the past 72 hour(s)). DG Cervical Spine 2 or 3 views  Result Date: 04/07/2020 CLINICAL DATA:  Cervicalgia with right-sided radicular symptoms EXAM: CERVICAL SPINE - 2-3 VIEW COMPARISON:  None. FINDINGS: Frontal, lateral, and open-mouth odontoid images were obtained. There is no fracture or spondylolisthesis. Prevertebral soft tissues and predental space regions are normal. There is moderate disc space narrowing at C5-6. Other disc spaces appear unremarkable. No erosive change. There is mild uncovertebral spurring at C5 and C6 bilaterally. Lung apices are clear. 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 No fracture or spondylolisthesis. Electronically Signed   By: Lowella Grip III M.D.   On: 04/07/2020 09:15  I, Lynne Leader, personally (independently) visualized and performed the interpretation of the images attached in this note.   Impression and Recommendations:    Assessment and Plan: 45 y.o. male with right neck pain and arm pain.  Pain consistent with C6 dermatomal pattern.  This is consistent with degenerative changes seen on his x-ray.  At this point he already has had a course of oral steroids which did not help.  Plan for physical therapy and trial of gabapentin.  If not improving will proceed with MRI.  If  worsening we will proceed with regular MRI earlier than we otherwise would.  The first time this pain was addressed with a physician or medical provider for this  episode was March 29, 2020.  PDMP not reviewed this encounter. Orders Placed This Encounter  Procedures  . DG Cervical Spine 2 or 3 views    Standing Status:   Future    Number of Occurrences:   1    Standing Expiration Date:   04/07/2021    Order Specific Question:   Reason for Exam (SYMPTOM  OR DIAGNOSIS REQUIRED)    Answer:   eval right cervi rad    Order Specific Question:   Preferred imaging location?    Answer:   [facility]  . Ambulatory referral to Physical Therapy    Referral Priority:   Routine    Referral Type:   Physical Medicine    Referral Reason:   Specialty Services Required    Requested Specialty:   Physical Therapy   Meds ordered this encounter  Medications  . gabapentin (NEURONTIN) 300 MG capsule    Sig: Take 1 capsule (300 mg total) by mouth 3 (three) times daily as needed (nerve pain).    Dispense:  90 capsule    Refill:  3    Discussed warning signs or symptoms. Please see discharge instructions. Patient expresses understanding.   The above documentation has been reviewed and is accurate and complete Lynne Leader, M.D.

## 2020-04-21 ENCOUNTER — Other Ambulatory Visit: Payer: Self-pay

## 2020-04-21 ENCOUNTER — Ambulatory Visit (INDEPENDENT_AMBULATORY_CARE_PROVIDER_SITE_OTHER): Payer: BC Managed Care – PPO | Admitting: Physical Therapy

## 2020-04-21 DIAGNOSIS — R600 Localized edema: Secondary | ICD-10-CM | POA: Diagnosis not present

## 2020-04-21 DIAGNOSIS — M6281 Muscle weakness (generalized): Secondary | ICD-10-CM | POA: Diagnosis not present

## 2020-04-21 DIAGNOSIS — M5412 Radiculopathy, cervical region: Secondary | ICD-10-CM | POA: Diagnosis not present
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
## 2020-04-21 NOTE — Patient Instructions (Signed)
Access Code: [email or web address] URL: [email or web address] Date: 04/21/2020 Prepared by: Elsie Ra  Exercises Supine Passive Cervical Retraction - 2 x daily - 7 x weekly - 3 sets - 10 reps Seated Assisted Cervical Rotation with Towel - 2 x daily - 7 x weekly - 1 sets - 10 reps - 5 hold Seated Thoracic Lumbar Extension with Pectoralis Stretch - 2 x daily - 7 x weekly - 3 sets - 10 reps Shoulder External Rotation and Scapular Retraction with Resistance - 2 x daily - 7 x weekly - 2-3 sets - 10 reps Standing Shoulder Horizontal Abduction with Resistance - 2 x daily - 7 x weekly - 2-3 sets - 10 reps Doorway Pec Stretch at 90 Degrees Abduction - 2 x daily - 7 x weekly - 1 sets - 3 reps - 30 hold

## 2020-05-05 ENCOUNTER — Other Ambulatory Visit: Payer: Self-pay

## 2020-05-05 ENCOUNTER — Ambulatory Visit: Payer: BC Managed Care – PPO | Admitting: Rehabilitative and Restorative Service Providers"

## 2020-05-05 DIAGNOSIS — R600 Localized edema: Secondary | ICD-10-CM

## 2020-05-05 DIAGNOSIS — M5412 Radiculopathy, cervical region: Secondary | ICD-10-CM | POA: Diagnosis not present

## 2020-05-05 DIAGNOSIS — M6281 Muscle weakness (generalized): Secondary | ICD-10-CM | POA: Diagnosis not present
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
## 2020-05-12 ENCOUNTER — Ambulatory Visit (INDEPENDENT_AMBULATORY_CARE_PROVIDER_SITE_OTHER): Payer: BC Managed Care – PPO | Admitting: Physical Therapy

## 2020-05-12 ENCOUNTER — Other Ambulatory Visit: Payer: Self-pay

## 2020-05-12 DIAGNOSIS — R600 Localized edema: Secondary | ICD-10-CM

## 2020-05-12 DIAGNOSIS — M6281 Muscle weakness (generalized): Secondary | ICD-10-CM

## 2020-05-12 DIAGNOSIS — M5412 Radiculopathy, cervical region: Secondary | ICD-10-CM | POA: Diagnosis not present
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
## 2020-05-19 ENCOUNTER — Ambulatory Visit: Payer: BC Managed Care – PPO | Admitting: Physical Therapy

## 2020-05-19 DIAGNOSIS — M5412 Radiculopathy, cervical region: Secondary | ICD-10-CM | POA: Diagnosis not present

## 2020-05-19 DIAGNOSIS — M6281 Muscle weakness (generalized): Secondary | ICD-10-CM

## 2020-05-19 DIAGNOSIS — R600 Localized edema: Secondary | ICD-10-CM | POA: Diagnosis not present
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
## 2020-06-01 ENCOUNTER — Encounter: Payer: Self-pay | Admitting: Family Medicine

## 2020-06-01 ENCOUNTER — Other Ambulatory Visit: Payer: Self-pay

## 2020-06-01 MED ORDER — TRIAMCINOLONE ACETONIDE 0.1 % EX CREA: TOPICAL_CREAM | CUTANEOUS | 2 refills | Status: DC

## 2020-06-01 MED ORDER — FLUTICASONE PROPIONATE 50 MCG/ACT NA SUSP: 2.0000 | NASAL | 11 refills | Status: DC | PRN

## 2020-06-02 ENCOUNTER — Ambulatory Visit (INDEPENDENT_AMBULATORY_CARE_PROVIDER_SITE_OTHER): Payer: BC Managed Care – PPO | Admitting: Physical Therapy

## 2020-06-02 ENCOUNTER — Other Ambulatory Visit: Payer: Self-pay

## 2020-06-02 DIAGNOSIS — R600 Localized edema: Secondary | ICD-10-CM | POA: Diagnosis not present

## 2020-06-02 DIAGNOSIS — M5412 Radiculopathy, cervical region: Secondary | ICD-10-CM | POA: Diagnosis not present

## 2020-06-02 DIAGNOSIS — M6281 Muscle weakness (generalized): Secondary | ICD-10-CM
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
## 2020-06-23 ENCOUNTER — Ambulatory Visit: Payer: BC Managed Care – PPO | Admitting: Physical Therapy

## 2020-06-23 ENCOUNTER — Other Ambulatory Visit: Payer: Self-pay

## 2020-06-23 ENCOUNTER — Encounter: Payer: Self-pay | Admitting: Physical Therapy

## 2020-06-23 DIAGNOSIS — R600 Localized edema: Secondary | ICD-10-CM | POA: Diagnosis not present

## 2020-06-23 DIAGNOSIS — M6281 Muscle weakness (generalized): Secondary | ICD-10-CM | POA: Diagnosis not present

## 2020-06-23 DIAGNOSIS — M5412 Radiculopathy, cervical region: Secondary | ICD-10-CM
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
## 2020-07-26 ENCOUNTER — Encounter: Payer: Self-pay | Admitting: Family Medicine

## 2020-12-10 ENCOUNTER — Ambulatory Visit: Payer: BC Managed Care – PPO | Admitting: Physician Assistant

## 2020-12-10 ENCOUNTER — Encounter: Payer: Self-pay | Admitting: Physician Assistant

## 2020-12-10 ENCOUNTER — Other Ambulatory Visit: Payer: Self-pay

## 2020-12-10 VITALS — BP 111/66 | HR 85 | Temp 98.4°F | Ht 67.5 in | Wt 152.0 lb

## 2020-12-10 DIAGNOSIS — K644 Residual hemorrhoidal skin tags: Secondary | ICD-10-CM

## 2020-12-10 DIAGNOSIS — Z800 Family history of malignant neoplasm of digestive organs: Secondary | ICD-10-CM | POA: Diagnosis not present

## 2020-12-10 MED ORDER — HYDROCORTISONE ACETATE 25 MG RE SUPP: 25.0000 mg | Freq: Two times a day (BID) | RECTAL | 0 refills | Status: DC
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
## 2020-12-10 NOTE — Patient Instructions (Signed)
-  Use Anusol suppositories -Witch hazel pads -Lidocaine jelly application -Stool softeners 1-2x daily -GI referral if worse

## 2020-12-15 ENCOUNTER — Encounter: Payer: Self-pay | Admitting: Gastroenterology

## 2020-12-20 ENCOUNTER — Encounter: Payer: Self-pay | Admitting: Physician Assistant

## 2020-12-20 ENCOUNTER — Other Ambulatory Visit: Payer: Self-pay

## 2020-12-20 ENCOUNTER — Ambulatory Visit: Payer: BC Managed Care – PPO | Admitting: Physician Assistant

## 2020-12-20 VITALS — BP 112/75 | HR 73 | Temp 98.2°F | Ht 67.5 in | Wt 155.2 lb

## 2020-12-20 DIAGNOSIS — L237 Allergic contact dermatitis due to plants, except food: Secondary | ICD-10-CM

## 2020-12-20 MED ORDER — METHYLPREDNISOLONE 4 MG PO TBPK: ORAL_TABLET | ORAL | 0 refills | Status: DC

## 2020-12-20 MED ORDER — DEXAMETHASONE SODIUM PHOSPHATE 4 MG/ML IJ SOLN
8.0000 mg | Freq: Once | INTRAMUSCULAR | Status: AC
  Administered 2020-12-20: 8 mg via INTRAMUSCULAR
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
## 2020-12-20 NOTE — Patient Instructions (Signed)
Decadron 8 mg IM given in office today Start on medrol dose pak tomorrow Keep cool and hydrated

## 2021-01-11 ENCOUNTER — Ambulatory Visit: Payer: BC Managed Care – PPO | Admitting: Gastroenterology

## 2021-01-11 ENCOUNTER — Encounter: Payer: Self-pay | Admitting: Gastroenterology

## 2021-01-11 VITALS — BP 110/78 | HR 86 | Ht 67.0 in | Wt 150.0 lb

## 2021-01-11 DIAGNOSIS — K648 Other hemorrhoids: Secondary | ICD-10-CM

## 2021-01-11 DIAGNOSIS — K625 Hemorrhage of anus and rectum: Secondary | ICD-10-CM

## 2021-01-11 MED ORDER — HYDROCORTISONE ACETATE 25 MG RE SUPP
25.0000 mg | Freq: Two times a day (BID) | RECTAL | 2 refills | Status: DC
Start: 2021-01-11 — End: 2021-11-09
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
## 2021-01-11 NOTE — Patient Instructions (Signed)
It was my pleasure to provide care to you today. Based on our discussion, I am providing you with my recommendations below:  RECOMMENDATION(S):   PRESCRIPTION MEDICATION(S):   We have sent the following medication(s) to your pharmacy:  Anusol supp  NOTE: If your medication(s) requires a PRIOR AUTHORIZATION, we will receive notification from your pharmacy. Once received, the process to submit for approval may take up to 7-10 business days. You will be contacted about any denials we have received from your insurance company as well as alternatives recommended by your provider.  COLONOSCOPY:   You have been scheduled for a colonoscopy. Please follow written instructions given to you at your visit today.   PREP:   Please pick up your prep supplies at the pharmacy within the next 1-3 days.  INHALERS:   If you use inhalers (even only as needed), please bring them with you on the day of your procedure.  COLONOSCOPY TIPS:  To reduce nausea and dehydration, stay well hydrated for 3-4 days prior to the exam.  To prevent skin/hemorrhoid irritation - prior to wiping, put A&Dointment or vaseline on the toilet paper. Keep a towel or pad on the bed.  BEFORE STARTING YOUR PREP, drink  64oz of clear liquids in the morning. This will help to flush the colon and will ensure you are well hydrated!!!!  NOTE - This is in addition to the fluids required for to complete your prep. Use of a flavored hard candy, such as grape Anise Salvo, can counteract some of the flavor of the prep and may prevent some nausea.   FOLLOW UP:  After your procedure, you will receive a call from my office staff regarding my recommendation for follow up.  BMI:  If you are age 36 or younger, your body mass index should be between 19-25. Your Body mass index is 23.49 kg/m. If this is out of the aformentioned range listed, please consider follow up with your Primary Care Provider.   MY CHART:  The [facility] providers  would like to encourage you to use [facility] to communicate with providers for non-urgent requests or questions.  Due to long hold times on the telephone, sending your provider a message by [facility] may be a faster and more efficient way to get a response.  Please allow 48 business hours for a response.  Please remember that this is for non-urgent requests.   Thank you for trusting me with your gastrointestinal care!    Thornton Park, MD, MPH

## 2021-01-16 ENCOUNTER — Encounter: Payer: Self-pay | Admitting: Gastroenterology

## 2021-01-25 ENCOUNTER — Ambulatory Visit (AMBULATORY_SURGERY_CENTER): Payer: BC Managed Care – PPO | Admitting: Gastroenterology

## 2021-01-25 ENCOUNTER — Encounter: Payer: Self-pay | Admitting: Gastroenterology

## 2021-01-25 VITALS — BP 101/69 | HR 63 | Temp 98.9°F | Resp 12 | Ht 67.0 in | Wt 150.0 lb

## 2021-01-25 DIAGNOSIS — D128 Benign neoplasm of rectum: Secondary | ICD-10-CM

## 2021-01-25 DIAGNOSIS — K621 Rectal polyp: Secondary | ICD-10-CM

## 2021-01-25 DIAGNOSIS — K625 Hemorrhage of anus and rectum: Secondary | ICD-10-CM | POA: Diagnosis not present

## 2021-01-25 DIAGNOSIS — D125 Benign neoplasm of sigmoid colon: Secondary | ICD-10-CM

## 2021-01-25 DIAGNOSIS — K648 Other hemorrhoids: Secondary | ICD-10-CM | POA: Diagnosis not present

## 2021-01-25 DIAGNOSIS — K635 Polyp of colon: Secondary | ICD-10-CM | POA: Diagnosis not present

## 2021-01-25 MED ORDER — SODIUM CHLORIDE 0.9 % IV SOLN
500.0000 mL | Freq: Once | INTRAVENOUS | Status: DC
Start: 2021-01-25 — End: 2021-01-25

## 2021-01-25 NOTE — Progress Notes (Signed)
Report given to PACU, vss 

## 2021-01-25 NOTE — Patient Instructions (Signed)
YOU HAD AN ENDOSCOPIC PROCEDURE TODAY: Refer to the procedure report and other information in the discharge instructions given to you for any specific questions about what was found during the examination. If this information does not answer your questions, please call [place] office at [phone] to clarify.  ° °YOU SHOULD EXPECT: Some feelings of bloating in the abdomen. Passage of more gas than usual. Walking can help get rid of the air that was put into your GI tract during the procedure and reduce the bloating. If you had a lower endoscopy (such as a colonoscopy or flexible sigmoidoscopy) you may notice spotting of blood in your stool or on the toilet paper. Some abdominal soreness may be present for a day or two, also. ° °DIET: Your first meal following the procedure should be a light meal and then it is ok to progress to your normal diet. A half-sandwich or bowl of soup is an example of a good first meal. Heavy or fried foods are harder to digest and may make you feel nauseous or bloated. Drink plenty of fluids but you should avoid alcoholic beverages for 24 hours. If you had a esophageal dilation, please see attached instructions for diet.   ° °ACTIVITY: Your care partner should take you home directly after the procedure. You should plan to take it easy, moving slowly for the rest of the day. You can resume normal activity the day after the procedure however YOU SHOULD NOT DRIVE, use power tools, machinery or perform tasks that involve climbing or major physical exertion for 24 hours (because of the sedation medicines used during the test).  ° °SYMPTOMS TO REPORT IMMEDIATELY: °A gastroenterologist can be reached at any hour. Please call [phone]  for any of the following symptoms:  °Following lower endoscopy (colonoscopy, flexible sigmoidoscopy) °Excessive amounts of blood in the stool  °Significant tenderness, worsening of abdominal pains  °Swelling of the abdomen that is new, acute  °Fever of 100° or  higher  °Following upper endoscopy (EGD, EUS, ERCP, esophageal dilation) °Vomiting of blood or coffee ground material  °New, significant abdominal pain  °New, significant chest pain or pain under the shoulder blades  °Painful or persistently difficult swallowing  °New shortness of breath  °Black, tarry-looking or red, bloody stools ° °FOLLOW UP:  °If any biopsies were taken you will be contacted by phone or by letter within the next 1-3 weeks. Call [phone]  if you have not heard about the biopsies in 3 weeks.  °Please also call with any specific questions about appointments or follow up tests. ° °

## 2021-01-25 NOTE — Progress Notes (Signed)
VS taken by DT 

## 2021-01-25 NOTE — Op Note (Signed)
[facility] Patient Name: Roy Webb Procedure Date: 01/25/2021 4:07 PM MRN: [id number] Endoscopist: Thornton Park MD, MD Age: 45 Referring MD:  Date of Birth: 1975-09-04 Gender: Male Account #: [id number] Procedure:                Colonoscopy Indications:              Rectalfullness andbleeding attributed to                            externalhemorrhoids not obviously seen on rectal                            exam today                           History of thrombosed hemorrhoid several years ago                           Possible family history of colon cancer (paternal                            uncle in his 2s) Medicines:                Monitored Anesthesia Care Procedure:                Pre-Anesthesia Assessment:                           - Prior to the procedure, a History and Physical                            was performed, and patient medications and                            allergies were reviewed. The patient's tolerance of                            previous anesthesia was also reviewed. The risks                            and benefits of the procedure and the sedation                            options and risks were discussed with the patient.                            All questions were answered, and informed consent                            was obtained. Prior Anticoagulants: The patient has                            taken no previous anticoagulant or antiplatelet                            agents. ASA  Grade Assessment: I - A normal, healthy                            patient. After reviewing the risks and benefits,                            the patient was deemed in satisfactory condition to                            undergo the procedure.                           After obtaining informed consent, the colonoscope                            was passed under direct vision. Throughout the                            procedure, the patient's  blood pressure, pulse, and                            oxygen saturations were monitored continuously. The                            Olympus CF-HQ190L ([id number]) Colonoscope was                            introduced through the anus and advanced to the 3                            cm into the ileum. A second forward view of the                            right colon was performed. The colonoscopy was                            performed without difficulty. The patient tolerated                            the procedure well. The quality of the bowel                            preparation was good. The terminal ileum, ileocecal                            valve, appendiceal orifice, and rectum were                            photographed. Scope In: 4:25:30 PM Scope Out: 4:44:06 PM Scope Withdrawal Time: 0 hours 15 minutes 22 seconds  Total Procedure Duration: 0 hours 18 minutes 36 seconds  Findings:                 Non-bleeding external and internal hemorrhoids were  found.                           Three sessile polyps were found in the rectum. The                            polyps were 1 to 2 mm in size. These polyps were                            removed with a a cold forceps after failed attempt                            at removal with a cold snare. Resection and                            retrieval were complete. Estimated blood loss was                            minimal.                           A 5 mm polyp was found in the distal sigmoid colon.                            The polyp was sessile. The polyp was removed with a                            cold snare. Resection and retrieval were complete.                            Estimated blood loss was minimal.                           A 5 mm polyp was found in the sigmoid colon. The                            polyp was sessile. The polyp was removed with a                            cold snare.  Resection and retrieval were complete.                            Estimated blood loss was minimal.                           A 2 mm polyp was found in the proximal sigmoid                            colon. The polyp was flat. The polyp was removed                            with a cold snare. Resection and retrieval were  complete. Estimated blood loss was minimal.                           The exam was otherwise without abnormality on                            direct and retroflexion views. Complications:            No immediate complications. Estimated blood loss:                            Minimal. Estimated Blood Loss:     Estimated blood loss was minimal. Impression:               - Non-bleeding external and internal hemorrhoids.                           - Three 1 to 2 mm polyps in the rectum, removed                            with a cold forceps. Resected and retrieved.                           - One 5 mm polyp in the distal sigmoid colon,                            removed with a cold snare. Resected and retrieved.                           - One 5 mm polyp in the sigmoid colon, removed with                            a cold snare. Resected and retrieved.                           - One 2 mm polyp in the proximal sigmoid colon,                            removed with a cold snare. Resected and retrieved.                           - The examination was otherwise normal on direct                            and retroflexion views. Recommendation:           - Patient has a contact number available for                            emergencies. The signs and symptoms of potential                            delayed complications were discussed with the  patient. Return to normal activities tomorrow.                            Written discharge instructions were provided to the                            patient.                            - Resume previous diet.                           - Continue present medications.                           - Await pathology results.                           - Repeat colonoscopy date to be determined after                            pending pathology results are reviewed for                            surveillance.                           - Emerging evidence supports eating a diet of                            fruits, vegetables, grains, calcium, and yogurt                            while reducing red meat and alcohol may reduce the                            risk of colon cancer.                           - Start using Anusol HC 2.5% applied sparingly to                            your rectum twice daily with further rectal                            fullness or bleeding.                           - Thank you for allowing me to be involved in your                            colon cancer prevention. Thornton Park MD, MD 01/25/2021 4:59:57 PM This report has been signed electronically.
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
## 2021-01-27 ENCOUNTER — Telehealth: Payer: Self-pay

## 2021-01-27 NOTE — Telephone Encounter (Signed)
  Follow up Call-  Call back number 01/25/2021  Post procedure Call Back phone  # [phone]  Permission to leave phone message Yes  Some recent data might be hidden     Patient questions:  Do you have a fever, pain , or abdominal swelling? No. Pain Score  0 *  Have you tolerated food without any problems? Yes.    Have you been able to return to your normal activities? Yes.    Do you have any questions about your discharge instructions: Diet   No. Medications  No. Follow up visit  No.  Do you have questions or concerns about your Care? No.  Actions: * If pain score is 4 or above: No action needed, pain <4.  Have you developed a fever since your procedure? no  2.   Have you had an respiratory symptoms (SOB or cough) since your procedure? no  3.   Have you tested positive for COVID 19 since your procedure no  4.   Have you had any family members/close contacts diagnosed with the COVID 19 since your procedure?  no   If yes to any of these questions please route to Joylene John, RN and Joella Prince, RN

## 2021-02-01 ENCOUNTER — Encounter: Payer: Self-pay | Admitting: Gastroenterology

## 2021-06-29 ENCOUNTER — Other Ambulatory Visit: Payer: Self-pay | Admitting: Family Medicine

## 2021-07-05 ENCOUNTER — Telehealth: Payer: Self-pay | Admitting: Family Medicine

## 2021-07-05 DIAGNOSIS — K625 Hemorrhage of anus and rectum: Secondary | ICD-10-CM

## 2021-07-05 DIAGNOSIS — K648 Other hemorrhoids: Secondary | ICD-10-CM

## 2021-07-05 NOTE — Telephone Encounter (Signed)
.. ?  Encourage patient to contact the pharmacy for refills or they can request refills through [facility] ? ?LAST APPOINTMENT DATE:  11/2020 with Alyssa for same day  ? ?NEXT APPOINTMENT DATE: none ? ?MEDICATION:Flonase and Kenalog  ? ?Is the patient out of medication? Yes ? ?PHARMACY[facility]  ? ?Let patient know to contact pharmacy at the end of the day to make sure medication is ready. ? ?Please notify patient to allow 48-72 hours to process  ?

## 2021-07-06 MED ORDER — TRIAMCINOLONE ACETONIDE 0.1 % EX CREA: TOPICAL_CREAM | CUTANEOUS | 2 refills | Status: DC

## 2021-07-06 MED ORDER — FLUTICASONE PROPIONATE 50 MCG/ACT NA SUSP: 2.0000 | NASAL | 11 refills | Status: DC | PRN

## 2021-11-09 ENCOUNTER — Encounter: Payer: Self-pay | Admitting: Family Medicine

## 2021-11-09 DIAGNOSIS — K648 Other hemorrhoids: Secondary | ICD-10-CM

## 2021-11-09 DIAGNOSIS — K625 Hemorrhage of anus and rectum: Secondary | ICD-10-CM

## 2021-11-09 MED ORDER — HYDROCORTISONE ACETATE 25 MG RE SUPP: 25.0000 mg | Freq: Two times a day (BID) | RECTAL | 2 refills | Status: DC

## 2021-11-11 ENCOUNTER — Encounter: Payer: Self-pay | Admitting: Nurse Practitioner

## 2021-11-11 ENCOUNTER — Ambulatory Visit: Payer: BC Managed Care – PPO | Admitting: Nurse Practitioner

## 2021-11-11 VITALS — BP 110/82 | HR 64 | Ht 68.0 in | Wt 152.0 lb

## 2021-11-11 DIAGNOSIS — K644 Residual hemorrhoidal skin tags: Secondary | ICD-10-CM

## 2021-11-11 DIAGNOSIS — K625 Hemorrhage of anus and rectum: Secondary | ICD-10-CM
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
## 2021-11-11 NOTE — Patient Instructions (Signed)
If you are age 44 or younger, your body mass index should be between 19-25. Your Body mass index is 23.11 kg/m. If this is out of the aformentioned range listed, please consider follow up with your Primary Care Provider.  ________________________________________________________  The [facility] providers would like to encourage you to use [facility] to communicate with providers for non-urgent requests or questions.  Due to long hold times on the telephone, sending your provider a message by [facility] may be a faster and more efficient way to get a response.  Please allow 48 business hours for a response.  Please remember that this is for non-urgent requests.  _______________________________________________________  Call the office if you notice any recurrent bleeding.  Thank you for entrusting me with your care and choosing [facility].  Tye Savoy, NP-C

## 2021-11-11 NOTE — Progress Notes (Signed)
Reviewed and agree with management plans. ? ?Beatriz Quintela L. Sharni Negron, MD, MPH  ?

## 2021-12-03 IMAGING — DX DG CERVICAL SPINE 2 OR 3 VIEWS
3 series · 3 of 3 positions shown · non-contrast
Comparison: None.

CLINICAL DATA: Cervicalgia with right-sided radicular symptoms

EXAM:
CERVICAL SPINE - 2-3 VIEW

[c-spine lat]
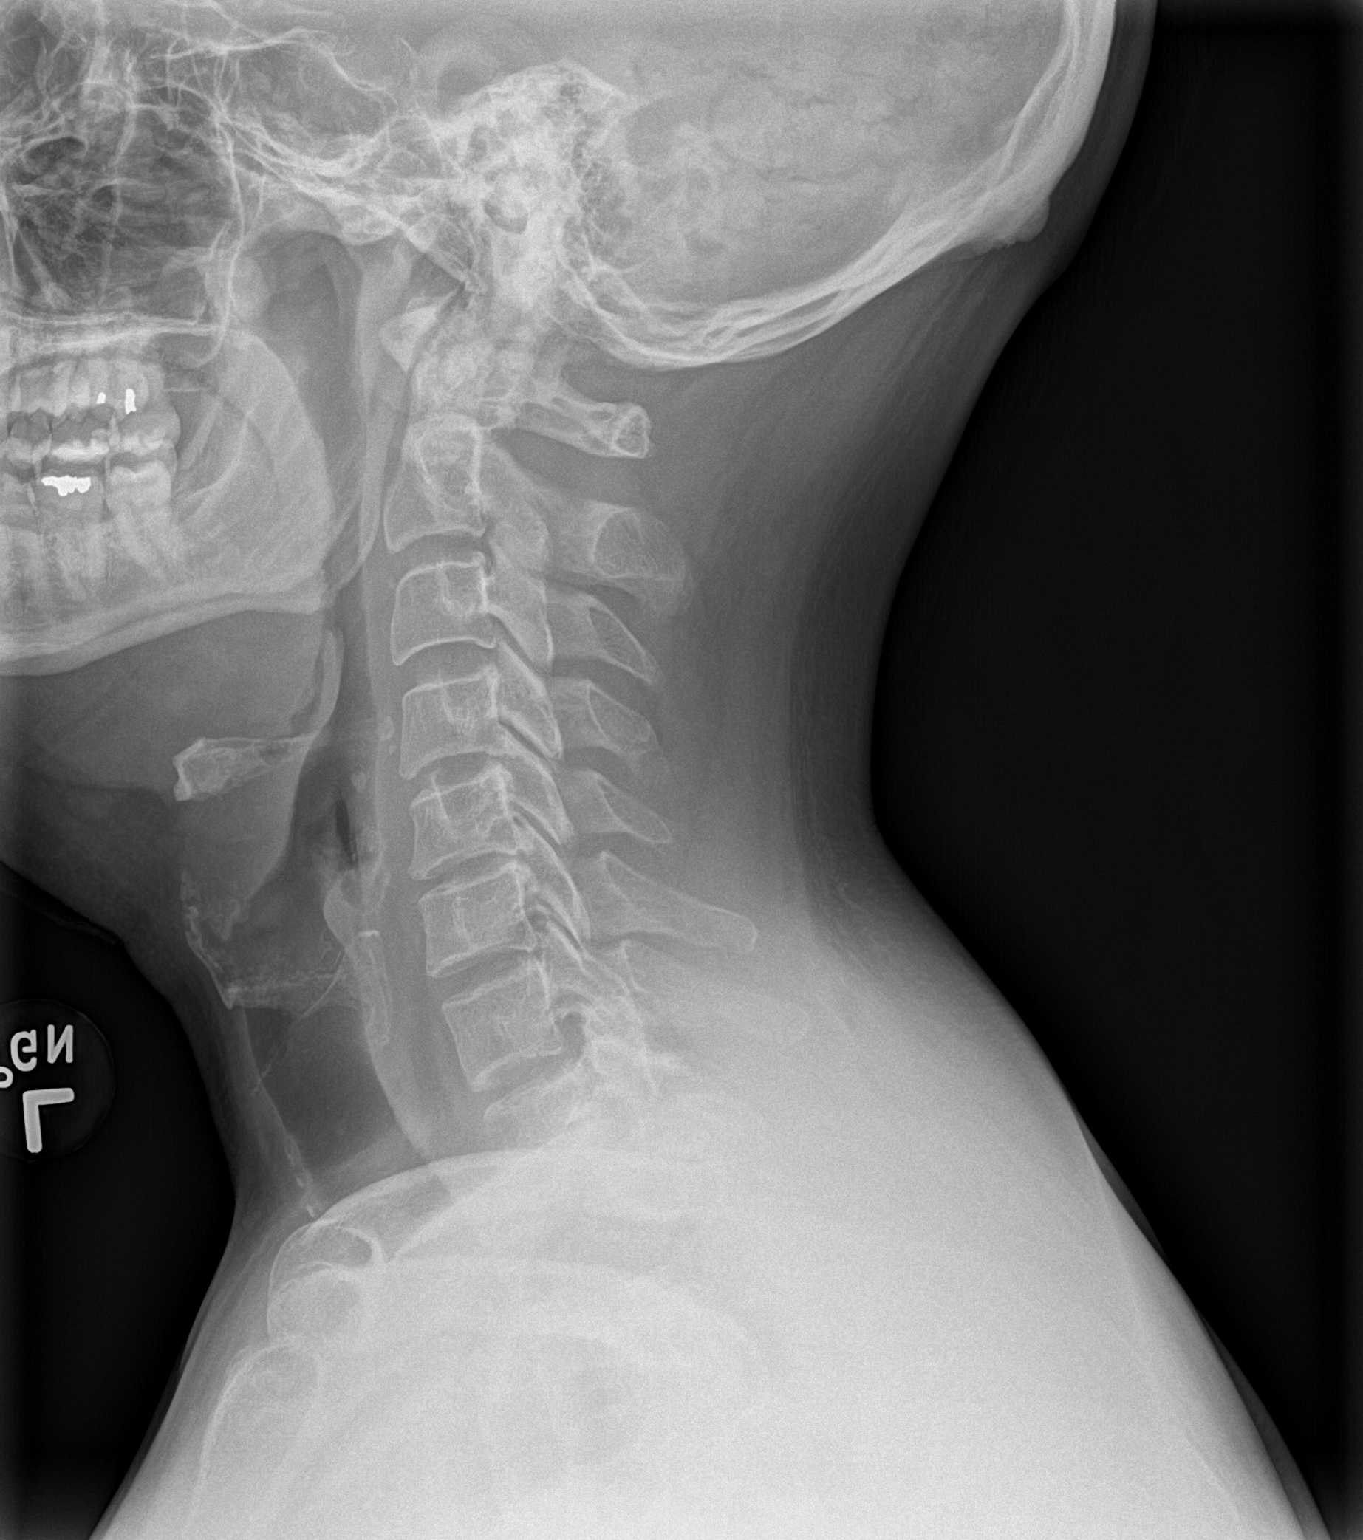

[c-spine ap]
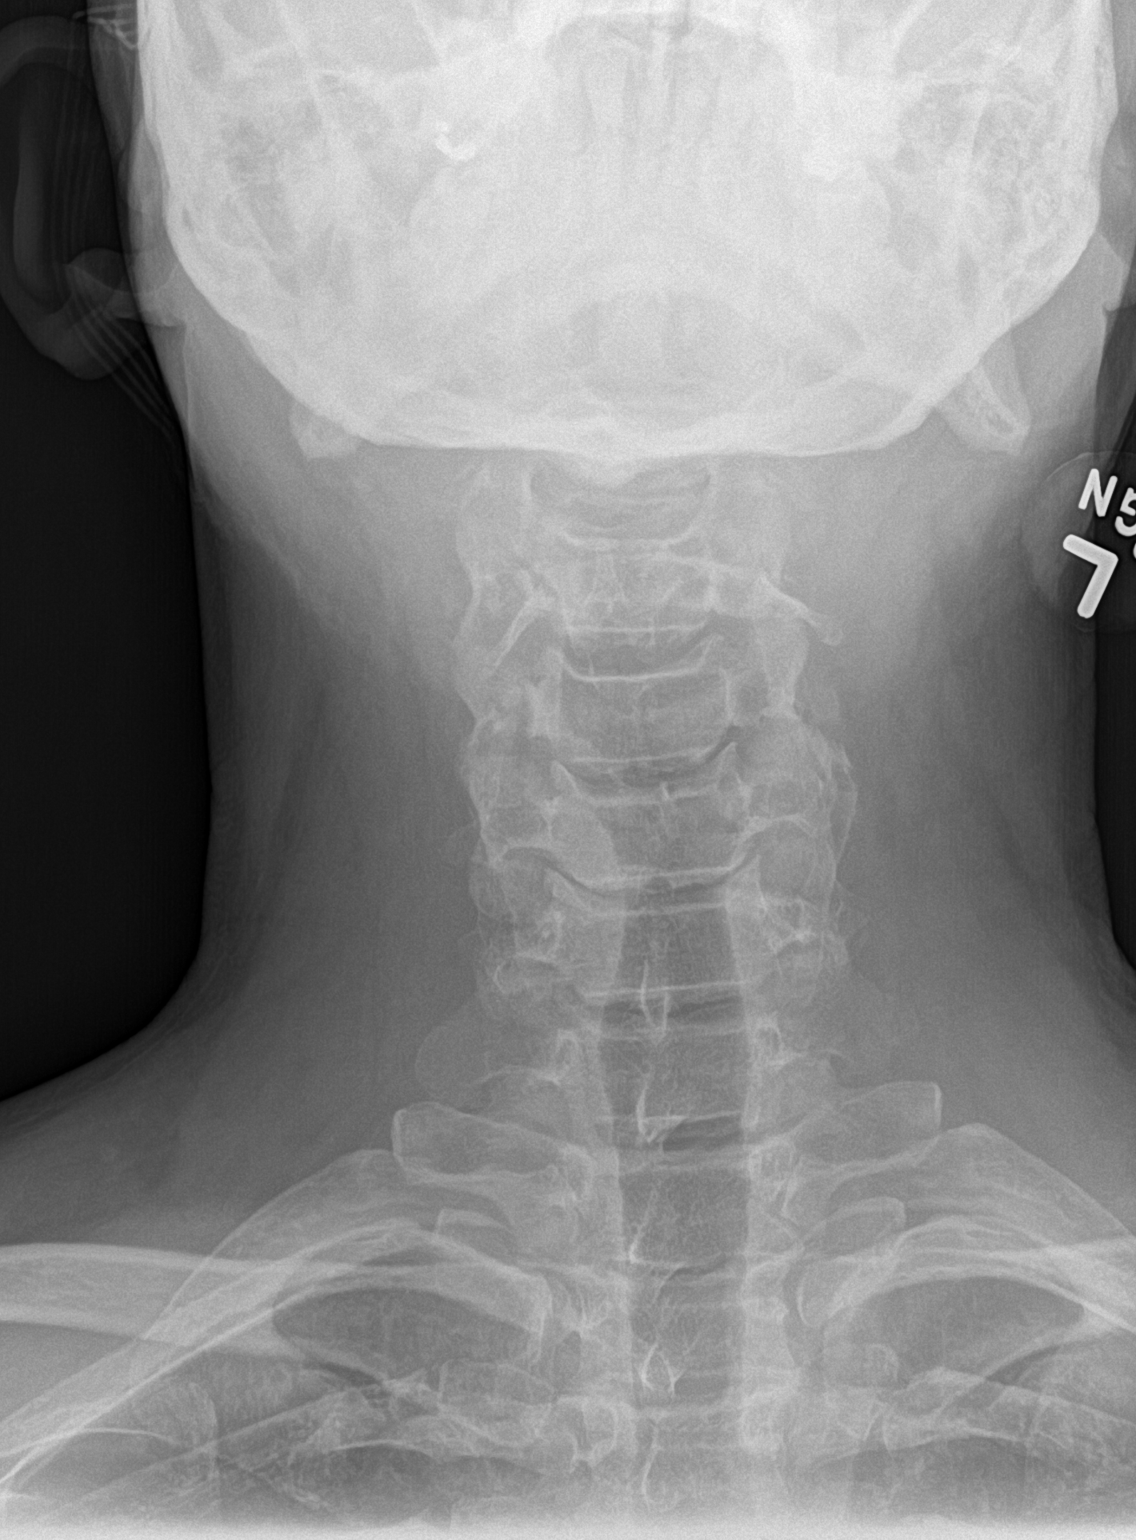

[c-spine open mouth]
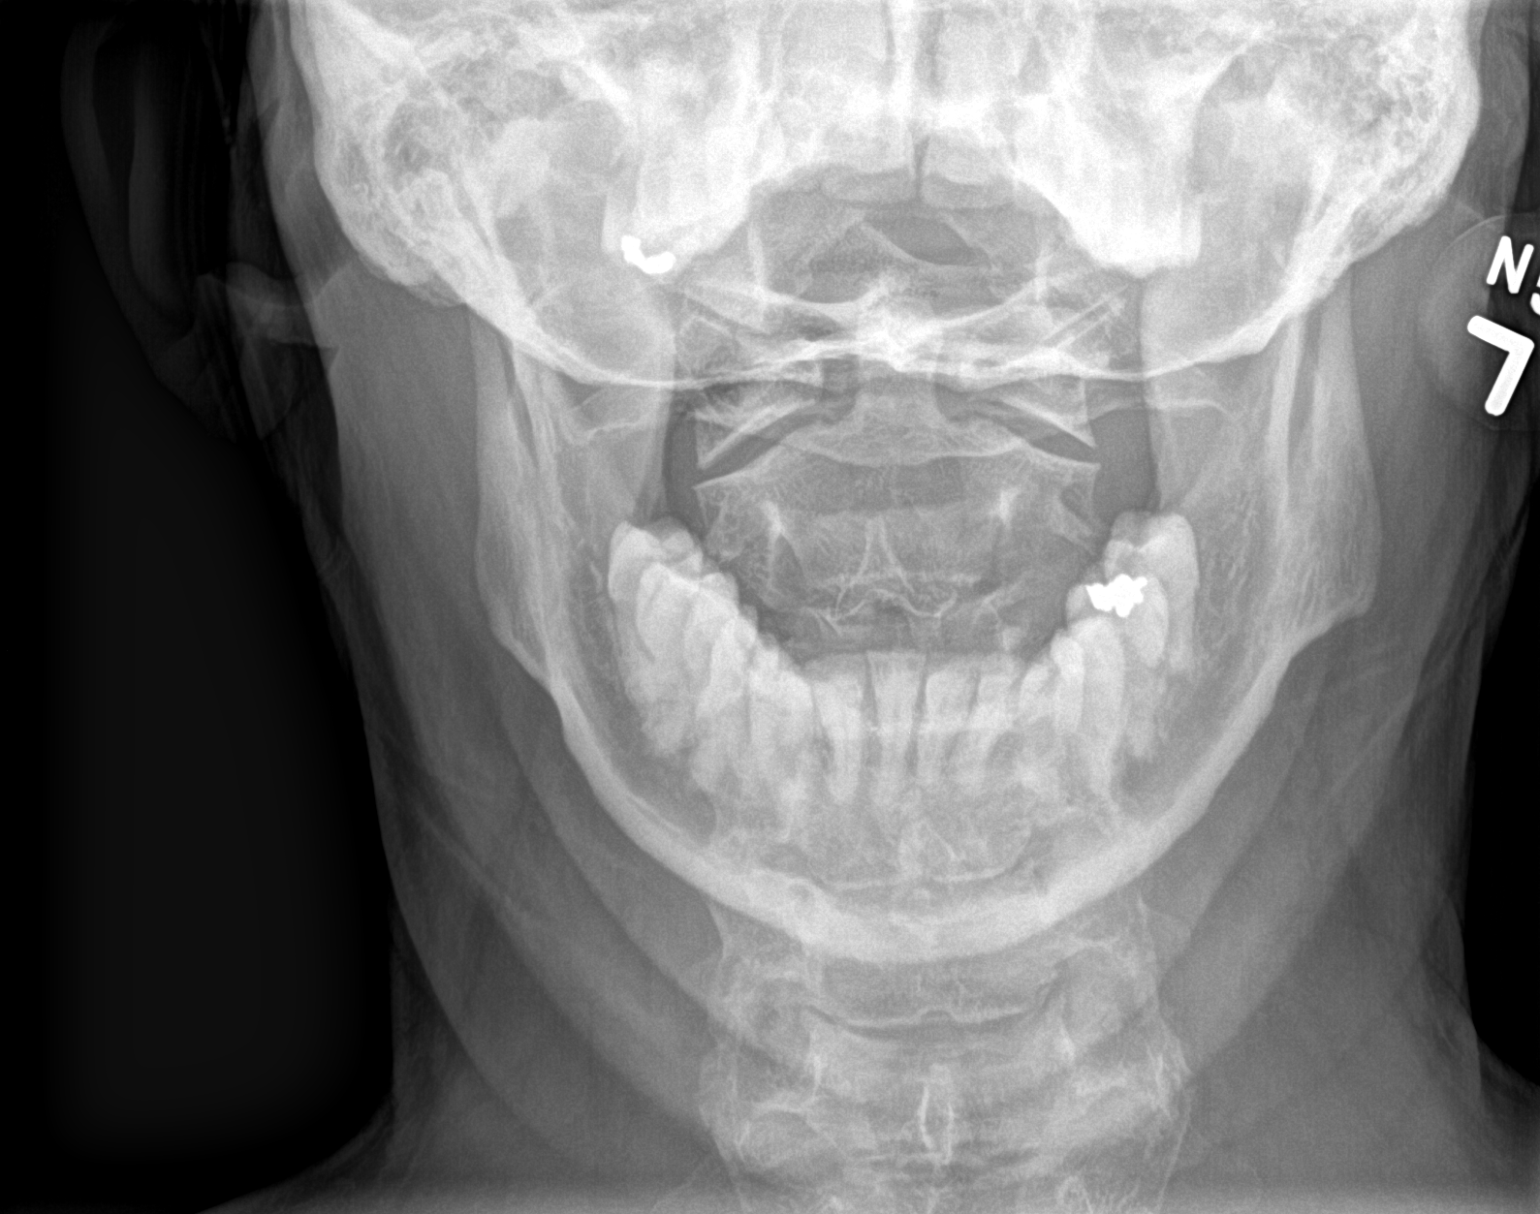

[3 of 3 positions shown; findings below may reference images not displayed]

FINDINGS: Frontal, lateral, and open-mouth odontoid images were obtained.
There is no fracture or spondylolisthesis. Prevertebral soft tissues
and predental space regions are normal. There is moderate disc space
narrowing at C5-6. Other disc spaces appear unremarkable. No erosive
change. There is mild uncovertebral spurring at C5 and C6
bilaterally. Lung apices are clear.
IMPRESSION: Relatively mild osteoarthritic change, most notable at C5-6. No
fracture or spondylolisthesis.

## 2021-12-19 ENCOUNTER — Encounter: Payer: Self-pay | Admitting: *Deleted

## 2022-01-10 ENCOUNTER — Encounter: Payer: Self-pay | Admitting: Family Medicine

## 2022-01-10 ENCOUNTER — Encounter: Payer: Self-pay | Admitting: Physician Assistant

## 2022-01-10 ENCOUNTER — Ambulatory Visit: Payer: BC Managed Care – PPO | Admitting: Physician Assistant

## 2022-01-10 VITALS — BP 119/79 | HR 62 | Temp 97.7°F

## 2022-01-10 DIAGNOSIS — J301 Allergic rhinitis due to pollen: Secondary | ICD-10-CM

## 2022-01-10 DIAGNOSIS — L237 Allergic contact dermatitis due to plants, except food: Secondary | ICD-10-CM

## 2022-01-10 MED ORDER — PREDNISONE 10 MG PO TABS: ORAL_TABLET | ORAL | 0 refills | Status: DC

## 2022-01-10 MED ORDER — TRIAMCINOLONE ACETONIDE 0.1 % EX CREA: TOPICAL_CREAM | CUTANEOUS | 2 refills | Status: DC

## 2022-01-10 MED ORDER — FLUTICASONE PROPIONATE 50 MCG/ACT NA SUSP: 2.0000 | NASAL | 11 refills | Status: DC | PRN

## 2022-01-10 MED ORDER — METHYLPREDNISOLONE ACETATE 40 MG/ML IJ SUSP
40.0000 mg | Freq: Once | INTRAMUSCULAR | Status: AC
  Administered 2022-01-10: 40 mg via INTRAMUSCULAR

## 2022-01-10 NOTE — Patient Instructions (Signed)
It was great to see you!  Steroid injection today Start oral steroid tomorrow  Start daily antihistamine of choice -- claritin, allegra, zyrtec (generic is fine) Consider daily pepcid/famotidine for this allergic reaction as well  Triamcinolone ointment any where but your face  Follow-up with Dr Yong Channel if rash does not go away completely
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
## 2022-01-12 ENCOUNTER — Ambulatory Visit: Payer: BC Managed Care – PPO | Admitting: Physician Assistant

## 2022-03-09 ENCOUNTER — Encounter: Payer: Self-pay | Admitting: *Deleted

## 2022-04-17 ENCOUNTER — Telehealth: Payer: BC Managed Care – PPO | Admitting: Physician Assistant

## 2022-04-17 DIAGNOSIS — J329 Chronic sinusitis, unspecified: Secondary | ICD-10-CM

## 2022-04-17 DIAGNOSIS — J40 Bronchitis, not specified as acute or chronic: Secondary | ICD-10-CM

## 2022-04-17 MED ORDER — PREDNISONE 10 MG (21) PO TBPK: ORAL_TABLET | ORAL | 0 refills | Status: DC

## 2022-04-17 MED ORDER — AMOXICILLIN-POT CLAVULANATE 875-125 MG PO TABS: 1.0000 | ORAL_TABLET | Freq: Two times a day (BID) | ORAL | 0 refills | Status: DC

## 2022-04-17 MED ORDER — PROMETHAZINE-DM 6.25-15 MG/5ML PO SYRP: 5.0000 mL | ORAL_SOLUTION | Freq: Four times a day (QID) | ORAL | 0 refills | Status: DC | PRN

## 2022-04-17 NOTE — Progress Notes (Signed)
Virtual Visit Consent   Roy Webb, you are scheduled for a virtual visit with a [facility] provider today. Just as with appointments in the office, your consent must be obtained to participate. Your consent will be active for this visit and any virtual visit you may have with one of our providers in the next 365 days. If you have a MyChart account, a copy of this consent can be sent to you electronically.  As this is a virtual visit, video technology does not allow for your provider to perform a traditional examination. This may limit your provider's ability to fully assess your condition. If your provider identifies any concerns that need to be evaluated in person or the need to arrange testing (such as labs, EKG, etc.), we will make arrangements to do so. Although advances in technology are sophisticated, we cannot ensure that it will always work on either your end or our end. If the connection with a video visit is poor, the visit may have to be switched to a telephone visit. With either a video or telephone visit, we are not always able to ensure that we have a secure connection.  By engaging in this virtual visit, you consent to the provision of healthcare and authorize for your insurance to be billed (if applicable) for the services provided during this visit. Depending on your insurance coverage, you may receive a charge related to this service.  I need to obtain your verbal consent now. Are you willing to proceed with your visit today? Roy Webb has provided verbal consent on 04/17/2022 for a virtual visit (video or telephone). Mar Daring, PA-C  Date: 04/17/2022 9:43 AM  Virtual Visit via Video Note   I, Mar Daring, connected with  Roy Webb  ([id number], [birth year]) on 04/17/22 at  9:45 AM EST by a video-enabled telemedicine application and verified that I am speaking with the correct person using two identifiers.  Location: Patient: Virtual Visit Location Patient:  Home Provider: Virtual Visit Location Provider: Home Office   I discussed the limitations of evaluation and management by telemedicine and the availability of in person appointments. The patient expressed understanding and agreed to proceed.    History of Present Illness: Roy Webb is a 47 y.o. who identifies as a male who was assigned male at birth, and is being seen today for sinus congestion and cough.  HPI: URI  This is a new problem. The current episode started in the past 7 days (Thursday, 04/13/22). The problem has been gradually worsening. Maximum temperature: subjective fevers. Associated symptoms include chest pain (tightness), congestion, coughing, ear pain (bilateral), headaches, a plugged ear sensation, rhinorrhea, sinus pain and a sore throat. Pertinent negatives include no diarrhea, nausea or vomiting. Associated symptoms comments: Hoarse voice yesterday, body aches, chills. He has tried acetaminophen, decongestant, NSAIDs, increased fluids and sleep (dayquil, nyquil, sudafed) for the symptoms. The treatment provided no relief.     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   Bilateral hearing loss 10/21/2008   Allergic rhinitis 12/04/2006    Allergies: No Known Allergies Medications:  Current Outpatient Medications:    amoxicillin-clavulanate (AUGMENTIN) 875-125 MG tablet, Take 1 tablet by mouth 2 (two) times daily., Disp: 14 tablet, Rfl: 0   predniSONE (STERAPRED UNI-PAK 21 TAB) 10 MG (21) TBPK tablet, 6 day taper; take as medication on package instructions, Disp: 21 tablet, Rfl:  0   promethazine-dextromethorphan (PROMETHAZINE-DM) 6.25-15 MG/5ML syrup, Take 5 mLs by mouth 4 (four) times daily as needed., Disp: 118 mL, Rfl: 0   fluticasone (FLONASE) 50 MCG/ACT nasal spray, Place 2 sprays into both nostrils as needed., Disp: 16 g, Rfl: 11   hydrocortisone  (ANUSOL-HC) 25 MG suppository, Place 1 suppository (25 mg total) rectally 2 (two) times daily. (Patient not taking: Reported on 11/11/2021), Disp: 12 suppository, Rfl: 2   triamcinolone cream (KENALOG) 0.1 %, APPLY TO SKIN 2 TIMES DAILY FOR 7 TO 10 DAYS, Disp: 80 g, Rfl: 2  Observations/Objective: Patient is well-developed, well-nourished in no acute distress.  Resting comfortably at home.  Head is normocephalic, atraumatic.  No labored breathing.  Speech is clear and coherent with logical content.  Patient is alert and oriented at baseline.    Assessment and Plan: 1. Sinobronchitis - predniSONE (STERAPRED UNI-PAK 21 TAB) 10 MG (21) TBPK tablet; 6 day taper; take as medication on package instructions  Dispense: 21 tablet; Refill: 0 - promethazine-dextromethorphan (PROMETHAZINE-DM) 6.25-15 MG/5ML syrup; Take 5 mLs by mouth 4 (four) times daily as needed.  Dispense: 118 mL; Refill: 0 - amoxicillin-clavulanate (AUGMENTIN) 875-125 MG tablet; Take 1 tablet by mouth 2 (two) times daily.  Dispense: 14 tablet; Refill: 0  - Worsening over a week despite OTC medications - Will treat with Augmentin, Prednisone, and Promethazine DM - Can continue Mucinex  - Push fluids.  - Rest.  - Steam and humidifier can help - Seek in person evaluation if worsening or symptoms fail to improve    Follow Up Instructions: I discussed the assessment and treatment plan with the patient. The patient was provided an opportunity to ask questions and all were answered. The patient agreed with the plan and demonstrated an understanding of the instructions.  A copy of instructions were sent to the patient via MyChart unless otherwise noted below.     The patient was advised to call back or seek an in-person evaluation if the symptoms worsen or if the condition fails to improve as anticipated.  Time:  I spent 10 minutes with the patient via telehealth technology discussing the above problems/concerns.    Mar Daring, PA-C

## 2022-04-17 NOTE — Patient Instructions (Signed)
Roy Webb, thank you for joining Mar Daring, PA-C for today's virtual visit.  While this provider is not your primary care provider (PCP), if your PCP is located in our provider database this encounter information will be shared with them immediately following your visit.   [facility] account gives you access to today's visit and all your visits, tests, and labs performed at [facility] " click here if you don't have a [facility] account or go to [email or web address]  Consent: (Patient) Roy Webb provided verbal consent for this virtual visit at the beginning of the encounter.  Current Medications:  Current Outpatient Medications:    amoxicillin-clavulanate (AUGMENTIN) 875-125 MG tablet, Take 1 tablet by mouth 2 (two) times daily., Disp: 14 tablet, Rfl: 0   predniSONE (STERAPRED UNI-PAK 21 TAB) 10 MG (21) TBPK tablet, 6 day taper; take as medication on package instructions, Disp: 21 tablet, Rfl: 0   promethazine-dextromethorphan (PROMETHAZINE-DM) 6.25-15 MG/5ML syrup, Take 5 mLs by mouth 4 (four) times daily as needed., Disp: 118 mL, Rfl: 0   fluticasone (FLONASE) 50 MCG/ACT nasal spray, Place 2 sprays into both nostrils as needed., Disp: 16 g, Rfl: 11   hydrocortisone (ANUSOL-HC) 25 MG suppository, Place 1 suppository (25 mg total) rectally 2 (two) times daily. (Patient not taking: Reported on 11/11/2021), Disp: 12 suppository, Rfl: 2   triamcinolone cream (KENALOG) 0.1 %, APPLY TO SKIN 2 TIMES DAILY FOR 7 TO 10 DAYS, Disp: 80 g, Rfl: 2   Medications ordered in this encounter:  Meds ordered this encounter  Medications   predniSONE (STERAPRED UNI-PAK 21 TAB) 10 MG (21) TBPK tablet    Sig: 6 day taper; take as medication on package instructions    Dispense:  21 tablet    Refill:  0    Order Specific Question:   Supervising Provider    Answer:   Chase Picket [[id number]]   promethazine-dextromethorphan (PROMETHAZINE-DM) 6.25-15 MG/5ML syrup     Sig: Take 5 mLs by mouth 4 (four) times daily as needed.    Dispense:  118 mL    Refill:  0    Order Specific Question:   Supervising Provider    Answer:   Chase Picket [id number]   amoxicillin-clavulanate (AUGMENTIN) 875-125 MG tablet    Sig: Take 1 tablet by mouth 2 (two) times daily.    Dispense:  14 tablet    Refill:  0    Order Specific Question:   Supervising Provider    Answer:   Chase Picket [id number]     *If you need refills on other medications prior to your next appointment, please contact your pharmacy*  Follow-Up: Call back or seek an in-person evaluation if the symptoms worsen or if the condition fails to improve as anticipated.  [facility] [phone]  Other Instructions  Sinus Infection, Adult A sinus infection, also called sinusitis, is inflammation of your sinuses. Sinuses are hollow spaces in the bones around your face. Your sinuses are located: Around your eyes. In the middle of your forehead. Behind your nose. In your cheekbones. Mucus normally drains out of your sinuses. When your nasal tissues become inflamed or swollen, mucus can become trapped or blocked. This allows bacteria, viruses, and fungi to grow, which leads to infection. Most infections of the sinuses are caused by a virus. A sinus infection can develop quickly. It can last for up to 4 weeks (acute) or for more than 12 weeks (chronic). A sinus infection  often develops after a cold. What are the causes? This condition is caused by anything that creates swelling in the sinuses or stops mucus from draining. This includes: Allergies. Asthma. Infection from bacteria or viruses. Deformities or blockages in your nose or sinuses. Abnormal growths in the nose (nasal polyps). Pollutants, such as chemicals or irritants in the air. Infection from fungi. This is rare. What increases the risk? You are more likely to develop this condition if you: Have a weak body defense  system (immune system). Do a lot of swimming or diving. Overuse nasal sprays. Smoke. What are the signs or symptoms? The main symptoms of this condition are pain and a feeling of pressure around the affected sinuses. Other symptoms include: Stuffy nose or congestion that makes it difficult to breathe through your nose. Thick yellow or greenish drainage from your nose. Tenderness, swelling, and warmth over the affected sinuses. A cough that may get worse at night. Decreased sense of smell and taste. Extra mucus that collects in the throat or the back of the nose (postnasal drip) causing a sore throat or bad breath. Tiredness (fatigue). Fever. How is this diagnosed? This condition is diagnosed based on: Your symptoms. Your medical history. A physical exam. Tests to find out if your condition is acute or chronic. This may include: Checking your nose for nasal polyps. Viewing your sinuses using a device that has a light (endoscope). Testing for allergies or bacteria. Imaging tests, such as an MRI or CT scan. In rare cases, a bone biopsy may be done to rule out more serious types of fungal sinus disease. How is this treated? Treatment for a sinus infection depends on the cause and whether your condition is chronic or acute. If caused by a virus, your symptoms should go away on their own within 10 days. You may be given medicines to relieve symptoms. They include: Medicines that shrink swollen nasal passages (decongestants). A spray that eases inflammation of the nostrils (topical intranasal corticosteroids). Rinses that help get rid of thick mucus in your nose (nasal saline washes). Medicines that treat allergies (antihistamines). Over-the-counter pain relievers. If caused by bacteria, your health care provider may recommend waiting to see if your symptoms improve. Most bacterial infections will get better without antibiotic medicine. You may be given antibiotics if you have: A severe  infection. A weak immune system. If caused by narrow nasal passages or nasal polyps, surgery may be needed. Follow these instructions at home: Medicines Take, use, or apply over-the-counter and prescription medicines only as told by your health care provider. These may include nasal sprays. If you were prescribed an antibiotic medicine, take it as told by your health care provider. Do not stop taking the antibiotic even if you start to feel better. Hydrate and humidify  Drink enough fluid to keep your urine pale yellow. Staying hydrated will help to thin your mucus. Use a cool mist humidifier to keep the humidity level in your home above 50%. Inhale steam for 10-15 minutes, 3-4 times a day, or as told by your health care provider. You can do this in the bathroom while a hot shower is running. Limit your exposure to cool or dry air. Rest Rest as much as possible. Sleep with your head raised (elevated). Make sure you get enough sleep each night. General instructions  Apply a warm, moist washcloth to your face 3-4 times a day or as told by your health care provider. This will help with discomfort. Use nasal saline washes as  often as told by your health care provider. Wash your hands often with soap and water to reduce your exposure to germs. If soap and water are not available, use hand sanitizer. Do not smoke. Avoid being around people who are smoking (secondhand smoke). Keep all follow-up visits. This is important. Contact a health care provider if: You have a fever. Your symptoms get worse. Your symptoms do not improve within 10 days. Get help right away if: You have a severe headache. You have persistent vomiting. You have severe pain or swelling around your face or eyes. You have vision problems. You develop confusion. Your neck is stiff. You have trouble breathing. These symptoms may be an emergency. Get help right away. Call 911. Do not wait to see if the symptoms will go  away. Do not drive yourself to the hospital. Summary A sinus infection is soreness and inflammation of your sinuses. Sinuses are hollow spaces in the bones around your face. This condition is caused by nasal tissues that become inflamed or swollen. The swelling traps or blocks the flow of mucus. This allows bacteria, viruses, and fungi to grow, which leads to infection. If you were prescribed an antibiotic medicine, take it as told by your health care provider. Do not stop taking the antibiotic even if you start to feel better. Keep all follow-up visits. This is important. This information is not intended to replace advice given to you by your health care provider. Make sure you discuss any questions you have with your health care provider. Document Revised: 02/15/2021 Document Reviewed: 02/15/2021 Elsevier Patient Education  [place].    If you have been instructed to have an in-person evaluation today at a local Urgent Care facility, please use the link below. It will take you to a list of all of our available [facility], including address, phone number and hours of operation. Please do not delay care.  [facility]  If you or a family member do not have a primary care provider, use the link below to schedule a visit and establish care. When you choose a [facility] primary care physician or advanced practice provider, you gain a long-term partner in health. Find a Primary Care Provider  Learn more about [facility] in-office and virtual care options: [facility]

## 2022-11-30 DIAGNOSIS — M9902 Segmental and somatic dysfunction of thoracic region: Secondary | ICD-10-CM | POA: Diagnosis not present

## 2022-11-30 DIAGNOSIS — M546 Pain in thoracic spine: Secondary | ICD-10-CM | POA: Diagnosis not present

## 2022-11-30 DIAGNOSIS — M25511 Pain in right shoulder: Secondary | ICD-10-CM | POA: Diagnosis not present

## 2022-11-30 DIAGNOSIS — M9901 Segmental and somatic dysfunction of cervical region: Secondary | ICD-10-CM | POA: Diagnosis not present

## 2022-12-01 DIAGNOSIS — M9901 Segmental and somatic dysfunction of cervical region: Secondary | ICD-10-CM | POA: Diagnosis not present

## 2022-12-01 DIAGNOSIS — M25511 Pain in right shoulder: Secondary | ICD-10-CM | POA: Diagnosis not present

## 2022-12-01 DIAGNOSIS — M9902 Segmental and somatic dysfunction of thoracic region: Secondary | ICD-10-CM | POA: Diagnosis not present

## 2022-12-01 DIAGNOSIS — M546 Pain in thoracic spine: Secondary | ICD-10-CM | POA: Diagnosis not present

## 2022-12-05 DIAGNOSIS — M546 Pain in thoracic spine: Secondary | ICD-10-CM | POA: Diagnosis not present

## 2022-12-05 DIAGNOSIS — M25511 Pain in right shoulder: Secondary | ICD-10-CM | POA: Diagnosis not present

## 2022-12-05 DIAGNOSIS — M9902 Segmental and somatic dysfunction of thoracic region: Secondary | ICD-10-CM | POA: Diagnosis not present

## 2022-12-05 DIAGNOSIS — M9901 Segmental and somatic dysfunction of cervical region: Secondary | ICD-10-CM | POA: Diagnosis not present

## 2022-12-06 ENCOUNTER — Encounter: Payer: Self-pay | Admitting: Family Medicine

## 2022-12-06 DIAGNOSIS — M25511 Pain in right shoulder: Secondary | ICD-10-CM | POA: Diagnosis not present

## 2022-12-06 DIAGNOSIS — M9901 Segmental and somatic dysfunction of cervical region: Secondary | ICD-10-CM | POA: Diagnosis not present

## 2022-12-06 DIAGNOSIS — M9902 Segmental and somatic dysfunction of thoracic region: Secondary | ICD-10-CM | POA: Diagnosis not present

## 2022-12-06 DIAGNOSIS — M546 Pain in thoracic spine: Secondary | ICD-10-CM | POA: Diagnosis not present

## 2022-12-07 ENCOUNTER — Ambulatory Visit: Payer: BC Managed Care – PPO | Admitting: Family Medicine

## 2022-12-07 ENCOUNTER — Encounter: Payer: Self-pay | Admitting: Family Medicine

## 2022-12-07 VITALS — BP 122/82 | HR 73 | Ht 68.0 in | Wt 160.0 lb

## 2022-12-07 DIAGNOSIS — M79601 Pain in right arm: Secondary | ICD-10-CM

## 2022-12-07 DIAGNOSIS — M542 Cervicalgia: Secondary | ICD-10-CM

## 2022-12-07 DIAGNOSIS — M5412 Radiculopathy, cervical region: Secondary | ICD-10-CM

## 2022-12-07 MED ORDER — PREDNISONE 50 MG PO TABS: 50.0000 mg | ORAL_TABLET | Freq: Every day | ORAL | 0 refills | Status: DC

## 2022-12-07 MED ORDER — GABAPENTIN 300 MG PO CAPS: 300.0000 mg | ORAL_CAPSULE | Freq: Three times a day (TID) | ORAL | 3 refills | Status: DC | PRN

## 2022-12-07 NOTE — Progress Notes (Signed)
   I, Roy Webb, CMA acting as a scribe for Clementeen Graham, MD.  Roy Webb is a 47 y.o. male who presents to [facility] at [facility] today for neck pain. Pt was previously seen by Dr. Denyse Amass on 04/07/20 for cervical radiculopathy and was referred to PT and prescribed gabapentin. Today, pt reports continued flares up with "bad habits" such as posture. Sx did improve with PT. Notes 3 weeks ago, playing golf, when she right shoulder locked up. Notes n/t in the 2nd-4th fingers. Sx affecting ability to work and sleep. Sharp shooting pains into the arm. Has tried Chiro in the past with some relief [facility]). ROM has improved since onset.   Radiates: yes UE Numbness/tingling: yes UE Weakness: yes Aggravates: ROM Treatments tried: IBU, Chiro  Dx imaging: 04/07/20 C-spine XR  06/15/16 C-spine XR  Pertinent review of systems: No fevers or chills  Relevant historical information: Otherwise healthy   Exam:  BP 122/82   Pulse 73   Ht 5\' 8"  (1.727 m)   Wt 160 lb (72.6 kg)   SpO2 98%   BMI 24.33 kg/m  General: Well Developed, well nourished, and in no acute distress.   MSK: C-spine normal appearing Normal motion. Upper extremity strength is intact Reflexes are intact.  Right wrist normal appearing Positive Tinel's and Phalen's test carpal tunnel.    Lab and Radiology Results  Prior cervical spine x-ray 2022 showed mild degenerative changes.  Patient reports recent x-ray at chiropractor's office showing loss of cervical lordosis but no acute findings.    Assessment and Plan: 47 y.o. male with recurrent right cervical radiculopathy versus new carpal tunnel syndrome right side.  Patient has had a history of intermittent neck pain and cervical radiculopathy right side that has been well treated in the past with prednisone and gabapentin.  He states his current pain is very similar to previous pain.  He said 2 visits with a chiropractor and is feeling a little  bit better.  Plan to treat with prednisone and gabapentin.  There is some concern for carpal tunnel syndrome.  Will go ahead and add a night splint for carpal tunnel syndrome.  The medicines for cervical radiculopathy should be helpful.    If not better consider carpal tunnel injection or potentially cervical spine MRI or formal physical therapy.  He has been doing his home exercises taught in physical therapy previously and is getting chiropractor care.   PDMP not reviewed this encounter. No orders of the defined types were placed in this encounter.  Meds ordered this encounter  Medications   predniSONE (DELTASONE) 50 MG tablet    Sig: Take 1 tablet (50 mg total) by mouth daily.    Dispense:  5 tablet    Refill:  0   gabapentin (NEURONTIN) 300 MG capsule    Sig: Take 1 capsule (300 mg total) by mouth 3 (three) times daily as needed.    Dispense:  90 capsule    Refill:  3     Discussed warning signs or symptoms. Please see discharge instructions. Patient expresses understanding.   The above documentation has been reviewed and is accurate and complete Clementeen Graham, M.D.

## 2022-12-07 NOTE — Patient Instructions (Addendum)
Thank you for coming in today.   Try the prednisone and gabapentin   Use a heating pad.   Use a carpal tunnel wrist brace especially at night.   If not better I can add PT and even do an injection in the wrist or get a MRI.

## 2022-12-08 DIAGNOSIS — M9902 Segmental and somatic dysfunction of thoracic region: Secondary | ICD-10-CM | POA: Diagnosis not present

## 2022-12-08 DIAGNOSIS — M546 Pain in thoracic spine: Secondary | ICD-10-CM | POA: Diagnosis not present

## 2022-12-08 DIAGNOSIS — M25511 Pain in right shoulder: Secondary | ICD-10-CM | POA: Diagnosis not present

## 2022-12-08 DIAGNOSIS — M9901 Segmental and somatic dysfunction of cervical region: Secondary | ICD-10-CM | POA: Diagnosis not present

## 2022-12-13 DIAGNOSIS — M546 Pain in thoracic spine: Secondary | ICD-10-CM | POA: Diagnosis not present

## 2022-12-13 DIAGNOSIS — M9901 Segmental and somatic dysfunction of cervical region: Secondary | ICD-10-CM | POA: Diagnosis not present

## 2022-12-13 DIAGNOSIS — M25511 Pain in right shoulder: Secondary | ICD-10-CM | POA: Diagnosis not present

## 2022-12-13 DIAGNOSIS — M9902 Segmental and somatic dysfunction of thoracic region: Secondary | ICD-10-CM | POA: Diagnosis not present

## 2022-12-14 DIAGNOSIS — M9901 Segmental and somatic dysfunction of cervical region: Secondary | ICD-10-CM | POA: Diagnosis not present

## 2022-12-14 DIAGNOSIS — M9902 Segmental and somatic dysfunction of thoracic region: Secondary | ICD-10-CM | POA: Diagnosis not present

## 2022-12-14 DIAGNOSIS — M546 Pain in thoracic spine: Secondary | ICD-10-CM | POA: Diagnosis not present

## 2022-12-14 DIAGNOSIS — M25511 Pain in right shoulder: Secondary | ICD-10-CM | POA: Diagnosis not present

## 2022-12-15 ENCOUNTER — Encounter: Payer: Self-pay | Admitting: Family Medicine

## 2022-12-15 DIAGNOSIS — M5412 Radiculopathy, cervical region: Secondary | ICD-10-CM

## 2022-12-15 DIAGNOSIS — M79601 Pain in right arm: Secondary | ICD-10-CM

## 2022-12-15 NOTE — Telephone Encounter (Signed)
Forwarding to Dr. Denyse Amass as Lorain Childes.

## 2022-12-18 ENCOUNTER — Ambulatory Visit: Payer: BC Managed Care – PPO | Admitting: Family Medicine

## 2022-12-18 ENCOUNTER — Encounter: Payer: Self-pay | Admitting: Family Medicine

## 2022-12-18 ENCOUNTER — Other Ambulatory Visit: Payer: Self-pay

## 2022-12-18 ENCOUNTER — Ambulatory Visit (INDEPENDENT_AMBULATORY_CARE_PROVIDER_SITE_OTHER): Payer: BC Managed Care – PPO

## 2022-12-18 VITALS — BP 112/80 | HR 68 | Ht 68.0 in | Wt 163.0 lb

## 2022-12-18 DIAGNOSIS — M5412 Radiculopathy, cervical region: Secondary | ICD-10-CM

## 2022-12-18 DIAGNOSIS — M79601 Pain in right arm: Secondary | ICD-10-CM

## 2022-12-18 DIAGNOSIS — M4802 Spinal stenosis, cervical region: Secondary | ICD-10-CM | POA: Diagnosis not present

## 2022-12-18 DIAGNOSIS — M47812 Spondylosis without myelopathy or radiculopathy, cervical region: Secondary | ICD-10-CM | POA: Diagnosis not present

## 2022-12-18 DIAGNOSIS — M542 Cervicalgia: Secondary | ICD-10-CM | POA: Diagnosis not present

## 2022-12-18 DIAGNOSIS — G5601 Carpal tunnel syndrome, right upper limb: Secondary | ICD-10-CM | POA: Diagnosis not present

## 2022-12-18 MED ORDER — METHYLPREDNISOLONE ACETATE 40 MG/ML IJ SUSP
40.0000 mg | Freq: Once | INTRAMUSCULAR | Status: AC
  Administered 2022-12-18: 40 mg

## 2022-12-18 MED ORDER — PREDNISONE 50 MG PO TABS: 50.0000 mg | ORAL_TABLET | Freq: Every day | ORAL | 0 refills | Status: DC

## 2022-12-18 NOTE — Progress Notes (Signed)
I, Stevenson Clinch, CMA acting as a scribe for Clementeen Graham, MD.  Roy Webb is a 47 y.o. male who presents to [facility] at [facility] today for f/u of right cervical radiculopathy. Advised to continue with Chiropractor as need and was prescribed Prednisone and Gabapentin. Recommended night splint for right carpal tunnel symptoms. If not improvement, consider carpal tunnel injection, MRI C-Spine, or formal physical therapy.   Today, patient reports continued n/t in the right hand and fingers. Sx wax and wane. Some pain in the elbow and axillary region. Minimal neck pain, ROM well maintained. Continues to use heat and occasionally cold for the neck. Some relief of sx while on Prednisone but sx returned once completed. Short-term relief with Gabapentin.   Diagnotic Imaging: 06/15/16 - XR C-spine 04/07/20 - XR C-spine   Pertinent review of systems: No fevers or chills  Relevant historical information: Hyperlipidemia   Exam:  BP 112/80   Pulse 68   Ht 5\' 8"  (1.727 m)   Wt 163 lb (73.9 kg)   SpO2 98%   BMI 24.78 kg/m  General: Well Developed, well nourished, and in no acute distress.   MSK: C-spine: Normal. Nontender palpation. Minimally positive right-sided Spurling's test. Upper extremity strength is intact.  Right wrist normal appearing Normal wrist and hand motion.  Strength is intact. Mildly positive Tinel's carpal tunnel.  Pressure with ultrasound probe over the median nerve and carpal tunnel does reproduce some paresthesias into the hand.    Lab and Radiology Results  Procedure: Real-time Ultrasound Guided hydrodissection of right median nerve at right carpal tunnel Device: Philips Affiniti 50G/GE Logiq Images permanently stored and available for review in PACS Verbal informed consent obtained.  Discussed risks and benefits of procedure. Warned about infection, bleeding, hyperglycemia damage to structures among others. Patient expresses  understanding and agreement Time-out conducted.   Noted no overlying erythema, induration, or other signs of local infection.   Skin prepped in a sterile fashion.   Local anesthesia: Topical Ethyl chloride.   With sterile technique and under real time ultrasound guidance: 40 mg of Depo-Medrol and 1 mL of lidocaine injected into carpal tunnel around the median nerve. Fluid seen entering the carpal tunnel.   Completed without difficulty   Pain immediately resolved suggesting accurate placement of the medication.   Advised to call if fevers/chills, erythema, induration, drainage, or persistent bleeding.   Images permanently stored and available for review in the ultrasound unit.  Impression: Technically successful ultrasound guided injection.    X-ray images cervical spine obtained today personally and independently interpreted Loss of cervical lordosis indicating spasm.  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  No acute fractures or severe degenerative changes. Await formal radiology review    Assessment and Plan: 47 y.o. male with right hand and arm paresthesia and pain thought to be carpal tunnel syndrome and cervical radiculopathy.  I believe he very likely has both conditions with some degree of overlap.  He is worsening despite trials of conservative management including carpal tunnel wrist brace and prednisone and gabapentin.  Plan to proceed with further evaluation.  For carpal tunnel syndrome we will proceed with nerve conduction study and injection as above.  For cervical radiculopathy plan for x-ray and MRI cervical spine.  Check back we will get the results of either the MRI or the nerve conduction study.  I did prescribe prednisone course.  I do not expect he will take it but it is nice to have in case his symptoms  worsen abruptly.   PDMP not reviewed this encounter. Orders Placed This Encounter  Procedures   [place] LIMITED JOINT SPACE STRUCTURES UP RIGHT(NO LINKED CHARGES)    Order Specific  Question:   Reason for Exam (SYMPTOM  OR DIAGNOSIS REQUIRED)    Answer:   right wrist pain    Order Specific Question:   Preferred imaging location?    Answer:   [facility] Cervical Spine 2 or 3 views    Standing Status:   Future    Number of Occurrences:   1    Standing Expiration Date:   12/18/2023    Order Specific Question:   Reason for Exam (SYMPTOM  OR DIAGNOSIS REQUIRED)    Answer:   eval neck pain    Order Specific Question:   Preferred imaging location?    Answer:   [facility]   MR CERVICAL SPINE WO CONTRAST    Standing Status:   Future    Standing Expiration Date:   12/18/2023    Order Specific Question:   What is the patient's sedation requirement?    Answer:   No Sedation    Order Specific Question:   Does the patient have a pacemaker or implanted devices?    Answer:   No    Order Specific Question:   Preferred imaging location?    Answer:   [facility] (table limit-550lbs)   Ambulatory referral to Neurology    Referral Priority:   Routine    Referral Type:   Consultation    Referral Reason:   Specialty Services Required    Requested Specialty:   Neurology    Number of Visits Requested:   1   NCV with EMG(electromyography)    Standing Status:   Future    Standing Expiration Date:   12/18/2023   Meds ordered this encounter  Medications   DISCONTD: predniSONE (DELTASONE) 50 MG tablet    Sig: Take 1 tablet (50 mg total) by mouth daily.    Dispense:  5 tablet    Refill:  0   methylPREDNISolone acetate (DEPO-MEDROL) injection 40 mg    Carpal tunnel     Discussed warning signs or symptoms. Please see discharge instructions. Patient expresses understanding.   The above documentation has been reviewed and is accurate and complete Clementeen Graham, M.D.

## 2022-12-18 NOTE — Patient Instructions (Addendum)
Thank you for coming in today.   You received an injection today. Seek immediate medical attention if the joint becomes red, extremely painful, or is oozing fluid.

## 2022-12-18 NOTE — Telephone Encounter (Signed)
Pt seen in office today.

## 2022-12-19 DIAGNOSIS — M25511 Pain in right shoulder: Secondary | ICD-10-CM | POA: Diagnosis not present

## 2022-12-19 DIAGNOSIS — M546 Pain in thoracic spine: Secondary | ICD-10-CM | POA: Diagnosis not present

## 2022-12-19 DIAGNOSIS — M9902 Segmental and somatic dysfunction of thoracic region: Secondary | ICD-10-CM | POA: Diagnosis not present

## 2022-12-19 DIAGNOSIS — M9901 Segmental and somatic dysfunction of cervical region: Secondary | ICD-10-CM | POA: Diagnosis not present

## 2022-12-19 NOTE — Telephone Encounter (Signed)
Forwarding to Dr. Georgina Snell as [name].

## 2022-12-20 ENCOUNTER — Encounter: Payer: Self-pay | Admitting: Neurology

## 2022-12-20 DIAGNOSIS — M546 Pain in thoracic spine: Secondary | ICD-10-CM | POA: Diagnosis not present

## 2022-12-20 DIAGNOSIS — M9902 Segmental and somatic dysfunction of thoracic region: Secondary | ICD-10-CM | POA: Diagnosis not present

## 2022-12-20 DIAGNOSIS — M9901 Segmental and somatic dysfunction of cervical region: Secondary | ICD-10-CM | POA: Diagnosis not present

## 2022-12-20 DIAGNOSIS — M25511 Pain in right shoulder: Secondary | ICD-10-CM | POA: Diagnosis not present

## 2022-12-22 DIAGNOSIS — M9902 Segmental and somatic dysfunction of thoracic region: Secondary | ICD-10-CM | POA: Diagnosis not present

## 2022-12-22 DIAGNOSIS — M546 Pain in thoracic spine: Secondary | ICD-10-CM | POA: Diagnosis not present

## 2022-12-22 DIAGNOSIS — M25511 Pain in right shoulder: Secondary | ICD-10-CM | POA: Diagnosis not present

## 2022-12-22 DIAGNOSIS — M9901 Segmental and somatic dysfunction of cervical region: Secondary | ICD-10-CM | POA: Diagnosis not present

## 2022-12-26 DIAGNOSIS — M9901 Segmental and somatic dysfunction of cervical region: Secondary | ICD-10-CM | POA: Diagnosis not present

## 2022-12-26 DIAGNOSIS — M546 Pain in thoracic spine: Secondary | ICD-10-CM | POA: Diagnosis not present

## 2022-12-26 DIAGNOSIS — M25511 Pain in right shoulder: Secondary | ICD-10-CM | POA: Diagnosis not present

## 2022-12-26 DIAGNOSIS — M9902 Segmental and somatic dysfunction of thoracic region: Secondary | ICD-10-CM | POA: Diagnosis not present

## 2022-12-27 DIAGNOSIS — M25511 Pain in right shoulder: Secondary | ICD-10-CM | POA: Diagnosis not present

## 2022-12-27 DIAGNOSIS — M9901 Segmental and somatic dysfunction of cervical region: Secondary | ICD-10-CM | POA: Diagnosis not present

## 2022-12-27 DIAGNOSIS — M9902 Segmental and somatic dysfunction of thoracic region: Secondary | ICD-10-CM | POA: Diagnosis not present

## 2022-12-27 DIAGNOSIS — M546 Pain in thoracic spine: Secondary | ICD-10-CM | POA: Diagnosis not present

## 2022-12-29 DIAGNOSIS — M9901 Segmental and somatic dysfunction of cervical region: Secondary | ICD-10-CM | POA: Diagnosis not present

## 2022-12-29 DIAGNOSIS — M25511 Pain in right shoulder: Secondary | ICD-10-CM | POA: Diagnosis not present

## 2022-12-29 DIAGNOSIS — M9902 Segmental and somatic dysfunction of thoracic region: Secondary | ICD-10-CM | POA: Diagnosis not present

## 2022-12-29 DIAGNOSIS — M546 Pain in thoracic spine: Secondary | ICD-10-CM | POA: Diagnosis not present

## 2023-01-08 ENCOUNTER — Ambulatory Visit
Admission: RE | Admit: 2023-01-08 | Discharge: 2023-01-08 | Disposition: A | Discharge status: Home / Self Care | Payer: BC Managed Care – PPO | Source: Ambulatory Visit | Attending: Family Medicine | Admitting: Family Medicine

## 2023-01-08 DIAGNOSIS — M79601 Pain in right arm: Secondary | ICD-10-CM

## 2023-01-08 DIAGNOSIS — M4802 Spinal stenosis, cervical region: Secondary | ICD-10-CM | POA: Diagnosis not present

## 2023-01-08 DIAGNOSIS — M5412 Radiculopathy, cervical region: Secondary | ICD-10-CM

## 2023-01-08 NOTE — Progress Notes (Signed)
Cervical spine x-ray shows a little bit of arthritis at C5-C6.

## 2023-01-16 ENCOUNTER — Ambulatory Visit: Payer: BC Managed Care – PPO | Admitting: Neurology

## 2023-01-16 DIAGNOSIS — G5601 Carpal tunnel syndrome, right upper limb: Secondary | ICD-10-CM

## 2023-01-16 DIAGNOSIS — R202 Paresthesia of skin: Secondary | ICD-10-CM

## 2023-01-16 NOTE — Procedures (Signed)
[facility] Neurology  [address] [place], [address]  [place], [place] [address] Tel: [phone] Fax: [phone] Test Date:  01/16/2023  Patient: Roy Webb DOB: 1975-05-28 Physician: Jacquelyne Balint, MD  Sex: Male Height: 5\' 8"  Ref Phys: Clementeen Graham, MD  ID#: [id number]   Technician:    History: This is a 47 year old male with numbness and tingling in his right hand.  NCV & EMG Findings: Extensive electrodiagnostic evaluation of the right upper limb shows: Right median, ulnar, radial, and median-ulnar palmar sensory responses are within normal limits. Right median (APB) and ulnar (ADM) motor responses are within normal limits. Right ulnar (ADM) F wave latency is within normal limits. There is no evidence of active or chronic motor axon loss changes affecting any of the tested muscles on needle examination. Motor unit configuration and recruitment pattern is within normal limits.  Impression: This is a normal study. Specifically: No electrodiagnostic evidence of a right cervical (C5-C8) motor radiculopathy. No electrodiagnostic evidence of a right median mononeuropathy at or distal to the wrist (ie: carpal tunnel syndrome). Screening studies for right ulnar or radial mononeuropathies are normal.    ___________________________ Jacquelyne Balint, MD    Nerve Conduction Studies Motor Nerve Results    Latency Amplitude F-Lat Segment Distance CV Comment  Site (ms) Norm (mV) Norm (ms)  (cm) (m/s) Norm   Right Median (APB) Motor  Wrist 2.6  < 3.9 7.6  > 6.0        Elbow 7.2 - 7.4 -  Elbow-Wrist 28 61  > 50   Right Ulnar (ADM) Motor  Wrist 1.78  < 3.1 12.3  > 7.0 27.0       Bel elbow 5.6 - 11.4 -  Bel elbow-Wrist 24 63  > 50   Ab elbow 7.4 - 10.8 -  Ab elbow-Bel elbow 10 56 -    Sensory Sites    Neg Peak Lat Amplitude (O-P) Segment Distance Velocity Comment  Site (ms) Norm (V) Norm  (cm) (ms)   Right Median Sensory  Wrist-Dig II 3.0  < 3.4 22  > 20 Wrist-Dig II 13    Right  Median-Ulnar Palmar Sensory       Median  Palm-Wrist 1.98  < 2.2 55  > 10 Palm-Wrist 8         Ulnar  Palm-Wrist 1.73  < 2.2 30  > 5 Palm-Wrist 8    Right Radial Sensory  Forearm-Wrist 2.3  < 2.7 39  > 18 Forearm-Wrist 10    Right Ulnar Sensory  Wrist-Dig V 2.8  < 3.1 20  > 12 Wrist-Dig V 11     Inter-Nerve Comparisons   Nerve 1 Value 1 Nerve 2 Value 2 Parameter Result Normal  Sensory Sites  R Median Palm-Wrist 1.98 ms R Ulnar Palm-Wrist 1.73 ms Peak Lat Diff 0.25 ms <0.40   Electromyography   Side Muscle Ins.Act Fibs Fasc Recrt Amp Dur Poly Activation Comment  Right FDI Nml Nml Nml Nml Nml Nml Nml Nml N/A  Right EIP Nml Nml Nml Nml Nml Nml Nml Nml N/A  Right FPL Nml Nml Nml Nml Nml Nml Nml Nml N/A  Right Pronator teres Nml Nml Nml Nml Nml Nml Nml Nml N/A  Right Biceps Nml Nml Nml Nml Nml Nml Nml Nml N/A  Right Triceps Nml Nml Nml Nml Nml Nml Nml Nml N/A  Right Deltoid Nml Nml Nml Nml Nml Nml Nml Nml N/A      Waveforms:  Motor  Sensory           F-Wave

## 2023-01-19 NOTE — Progress Notes (Signed)
Cervical spine MRI does show areas of pinched nerves that could be causing your arm pain.  I have ordered an epidural steroid injection which should help.

## 2023-01-19 NOTE — Progress Notes (Signed)
Nerve conduction study unfortunately did not show good evidence of pinched nerve.  Sometimes this test just cannot show the pain that you are experiencing.  Your MRI was much more helpful.

## 2023-01-22 NOTE — Discharge Instructions (Signed)

## 2023-01-23 ENCOUNTER — Ambulatory Visit
Admission: RE | Admit: 2023-01-23 | Discharge: 2023-01-23 | Disposition: A | Discharge status: Home / Self Care | Payer: BC Managed Care – PPO | Source: Ambulatory Visit | Attending: Family Medicine | Admitting: Family Medicine

## 2023-01-23 DIAGNOSIS — M79601 Pain in right arm: Secondary | ICD-10-CM

## 2023-01-23 DIAGNOSIS — M5412 Radiculopathy, cervical region: Secondary | ICD-10-CM | POA: Diagnosis not present

## 2023-01-23 MED ORDER — IOPAMIDOL (ISOVUE-M 300) INJECTION 61%
1.0000 mL | Freq: Once | INTRAMUSCULAR | Status: AC | PRN
  Administered 2023-01-23: 1 mL via EPIDURAL

## 2023-01-23 MED ORDER — TRIAMCINOLONE ACETONIDE 40 MG/ML IJ SUSP (RADIOLOGY)
60.0000 mg | Freq: Once | INTRAMUSCULAR | Status: AC
  Administered 2023-01-23: 60 mg via EPIDURAL

## 2023-01-30 ENCOUNTER — Encounter: Payer: BC Managed Care – PPO | Admitting: Neurology

## 2023-02-08 NOTE — Telephone Encounter (Signed)
We could try the injection again but if that does not work you really should talk with the surgeon at least.  Would you like me to order the injection again?

## 2023-02-12 ENCOUNTER — Other Ambulatory Visit: Payer: Self-pay

## 2023-02-12 DIAGNOSIS — M5412 Radiculopathy, cervical region: Secondary | ICD-10-CM

## 2023-02-12 DIAGNOSIS — M79601 Pain in right arm: Secondary | ICD-10-CM

## 2023-03-14 DIAGNOSIS — M4722 Other spondylosis with radiculopathy, cervical region: Secondary | ICD-10-CM | POA: Diagnosis not present

## 2023-05-23 ENCOUNTER — Encounter: Payer: Self-pay | Admitting: Family Medicine

## 2023-05-23 ENCOUNTER — Ambulatory Visit: Payer: BC Managed Care – PPO | Admitting: Family Medicine

## 2023-05-23 VITALS — BP 114/74 | HR 67 | Temp 98.8°F | Ht 68.0 in | Wt 160.0 lb

## 2023-05-23 DIAGNOSIS — L219 Seborrheic dermatitis, unspecified: Secondary | ICD-10-CM | POA: Diagnosis not present

## 2023-05-23 DIAGNOSIS — J301 Allergic rhinitis due to pollen: Secondary | ICD-10-CM | POA: Diagnosis not present

## 2023-05-23 DIAGNOSIS — Z23 Encounter for immunization: Secondary | ICD-10-CM

## 2023-05-23 MED ORDER — KETOCONAZOLE 1 % EX SHAM: MEDICATED_SHAMPOO | CUTANEOUS | 1 refills | Status: DC

## 2023-05-23 MED ORDER — TRIAMCINOLONE ACETONIDE 0.1 % EX LOTN: 1.0000 | TOPICAL_LOTION | Freq: Two times a day (BID) | CUTANEOUS | 1 refills | Status: DC

## 2023-05-23 MED ORDER — FLUTICASONE PROPIONATE 50 MCG/ACT NA SUSP: 1.0000 | Freq: Every day | NASAL | 6 refills | Status: AC

## 2023-05-23 NOTE — Patient Instructions (Addendum)
 Please follow up if symptoms do not improve or as needed.    VISIT SUMMARY:  During your visit, we discussed the rash on your scalp, which began after using [employer] Tea Tree Shampoo. We also reviewed your seasonal allergies and current treatments.  YOUR PLAN:  -SEBORRHEIC DERMATITIS: Seborrheic Dermatitis is a common skin condition that causes a red, itchy, and flaky rash. It is often related to an overgrowth of yeast on the skin. We suspect your rash may be related to the shampoo you were using. We have prescribed a steroid lotion to apply to the affected area. Additionally, we have prescribed ketoconazole shampoo to address any potential fungal infection.  -SEASONAL ALLERGIES: Seasonal allergies are allergic reactions that occur at certain times of the year, usually when outdoor molds release their spores, or trees, grasses, and weeds release tiny pollen particles into the air. You are currently using an over-the-counter nasal spray, which provides some relief. We have prescribed Fluticasone nasal spray, which is the same as the over-the-counter version, to be used as directed.  INSTRUCTIONS:  Please monitor your response to the treatments. If your symptoms persist or worsen, contact our office for further evaluation. Today you received your flu vaccination. We recommend getting it at the beginning of the flu season which would be early September 2025 as well.  Seborrheic Dermatitis, Adult Seborrheic dermatitis is a skin disease that causes red, scaly patches. It often occurs on the scalp, where it may be called dandruff. The patches may also appear on other parts of the body. Skin patches tend to occur where there are a lot of oil glands in the skin. Areas of the body that may be affected include: The scalp. The face, eyebrows, and ears. The area around a beard. Skin folds of the body. This includes the armpits, groin, and buttocks. The chest. The condition is often long-lasting  (chronic). It may come and go for no known reason. It may be activated by a trigger, such as: Cold weather. Being out in the sun. Stress. Drinking alcohol. What are the causes? The cause of this condition is not known. It may be related to having too much yeast on the skin or changes in how your body's disease-fighting system (immune system) works. What increases the risk? You may be more likely to develop this condition if: You have a weak immune system. You are 46 years old or older. You have other conditions, such as: Human immunodeficiency virus (HIV) or acquired immunodeficiency virus (AIDS). Parkinson's disease. Mood disorders, such as depression. Liver problems. Obesity. What are the signs or symptoms? Symptoms of this condition include: Thick scales on the scalp. Redness on the face or in the armpits. Skin that is flaky. The flakes may be white or yellow. Skin that seems oily or dry but is not helped with moisturizers. Itching or burning in the affected areas. How is this diagnosed? This condition is diagnosed with a medical history and physical exam. A sample of your skin may be tested (skin biopsy). You may need to see a skin specialist (dermatologist). How is this treated? There is no cure for this condition, but treatment can help to manage the symptoms. You may get treatment to remove scales, lower the risk of skin infection, and reduce swelling or itching. Treatment may include: Medicated shampoos, moisturizing creams, or ointments. Creams that reduce skin yeast. Creams that reduce swelling and irritation (steroids). Follow these instructions at home: Skin care Use any medicated shampoo, skin creams, or ointments  only as told by your health care provider. Do not use skin products that contain alcohol. Take lukewarm baths or showers. Avoid very hot water. When you are outside, wear a hat and clothes that block UV light. General instructions Apply over-the-counter  and prescription medicines only as told by your health care provider. Learn what triggers your symptoms so you can avoid these things. Use techniques for stress reduction, such as meditation or yoga. Do not drink alcohol if your health care provider tells you not to drink. Keep all follow-up visits. Your health care provider will check your skin to make sure the treatments are helping. Where to find more information American Academy of Dermatology: [employer] Contact a health care provider if: Your symptoms do not get better with treatment. Your symptoms get worse. You have new symptoms. Get help right away if: Your condition quickly gets worse, even with treatment. This information is not intended to replace advice given to you by your health care provider. Make sure you discuss any questions you have with your health care provider. Document Revised: 08/12/2021 Document Reviewed: 08/12/2021 Elsevier Patient Education  2024 [employer].

## 2023-05-23 NOTE — Progress Notes (Signed)
 Subjective  CC:  Chief Complaint  Patient presents with   Rash    Rash on Rt ear for the past 2 weeks. Very irritated and red. Seems like it has been spreading but not itching as much    Same day acute visit; PCP not available. New pt to me. Chart reviewed.   HPI: Roy Webb is a 48 y.o. male who presents to the office today to address the problems listed above in the chief complaint. Discussed the use of AI scribe software for clinical note transcription with the patient, who gave verbal consent to proceed.  History of Present Illness   The patient presents with a rash on the scalp, suspected to be related to shampoo use.  The rash on the scalp began after using [facility] Tea Tree Shampoo, which was used for approximately two months. It is localized above the ear and is not painful, though it is itchy and flaky. The rash started about three weeks ago, and the shampoo was discontinued about two weeks ago. It remains persistent despite stopping the shampoo.  The rash is not tender to touch and initially was very itchy. There is a history of eczema, primarily affecting the hands, and the rash is similar but not as severe. The rash is not limited to the scalp, as there was a small area at the base of the neck that has since resolved.  Seasonal allergies are managed with a generic Flonase nasal spray, which is effective. An oral antihistamine was tried but was not as helpful. Allergies are active again; no sxs of infection. Needs meds. No eye sxs. + nasal congestion and rhinorrhea. No cough      Assessment  1. Seborrheic dermatitis of scalp   2. Seasonal allergic rhinitis due to pollen   3. Need for influenza vaccination      Plan  Assessment and Plan    Seborrheic Dermatitis: Localized scalp rash, possibly related to use of [employer] tea tree shampoo. Not painful, initially itchy, now less so. No other skin conditions reported. -Prescribe steroid lotion for  application to affected area. Triamcinolone 0.1% lotion bid x 2 weeks, then prn -Prescribe ketoconazole shampoo for potential fungal component twice weekly as needed.  Seasonal Allergies: Currently using over-the-counter generic Flonase with some relief. -Prescribe Fluticasone nasal spray, same dose as over-the-counter version, to be used as directed.  Flu vaccine given in office today.  Follow-up: Monitor response to treatment and contact office if symptoms persist or worsen.        Orders Placed This Encounter  Procedures   Flu vaccine trivalent PF, 6mos and older(Flulaval,Afluria,Fluarix,Fluzone)   Meds ordered this encounter  Medications   KETOCONAZOLE, TOPICAL, 1 % SHAM    Sig: Use twice weekly as needed    Dispense:  200 mL    Refill:  1   triamcinolone lotion (KENALOG) 0.1 %    Sig: Apply 1 Application topically in the morning and at bedtime. Use for two weeks then as needed    Dispense:  60 mL    Refill:  1   fluticasone (FLONASE) 50 MCG/ACT nasal spray    Sig: Place 1 spray into both nostrils daily.    Dispense:  16 g    Refill:  6     I reviewed the patients updated PMH, FH, and SocHx.    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   KETOCONAZOLE, TOPICAL, 1 % SHAM Use twice weekly as needed   triamcinolone cream (KENALOG) 0.1 % APPLY TO SKIN 2 TIMES DAILY FOR 7 TO 10 DAYS   triamcinolone lotion (KENALOG) 0.1 % Apply 1 Application topically in the morning and at bedtime. Use for two weeks then as needed    Allergies: Patient has no known allergies. Family History: Patient family history includes Bladder Cancer in his paternal uncle; Colon cancer in his paternal uncle;  Colon polyps in his mother; Diabetes in his mother; Healthy in his brother and sister; Heart attack in his father; Heart disease in his maternal grandfather and maternal uncle; Lung cancer in his father; Prostate cancer in his paternal uncle. Social History:  Patient  reports that he quit smoking about 13 years ago. His smoking use included cigarettes. He started smoking about 28 years ago. He has a 3.8 pack-year smoking history. He has quit using smokeless tobacco.  His smokeless tobacco use included chew. He reports current alcohol use. He reports that he does not use drugs.  Review of Systems: Constitutional: Negative for fever malaise or anorexia Cardiovascular: negative for chest pain Respiratory: negative for SOB or persistent cough Gastrointestinal: negative for abdominal pain  Objective  Vitals: BP 114/74   Pulse 67   Temp 98.8 F (37.1 C)   Ht 5\' 8"  (1.727 m)   Wt 160 lb (72.6 kg)   SpO2 96%   BMI 24.33 kg/m  General: no acute distress , A&Ox3 HEENT: PEERL, conjunctiva normal, neck is supple Skin:  Warm, behind right ear and at scalp line: erythematous flaking rash with few papules. Non annular. Mild swelling non tender. No drainage or warmth.   Commons side effects, risks, benefits, and alternatives for medications and treatment plan prescribed today were discussed, and the patient expressed understanding of the given instructions. Patient is instructed to call or message via MyChart if he/she has any questions or concerns regarding our treatment plan. No barriers to understanding were identified. We discussed Red Flag symptoms and signs in detail. Patient expressed understanding regarding what to do in case of urgent or emergency type symptoms.  Medication list was reconciled, printed and provided to the patient in AVS. Patient instructions and summary information was reviewed with the patient as documented in the AVS. This note was prepared with assistance of Dragon voice  recognition software. Occasional wrong-word or sound-a-like substitutions may have occurred due to the inherent limitations of voice recognition software

## 2023-05-29 ENCOUNTER — Other Ambulatory Visit: Payer: Self-pay | Admitting: Physician Assistant

## 2023-06-18 ENCOUNTER — Encounter: Payer: Self-pay | Admitting: Family Medicine

## 2023-06-18 ENCOUNTER — Ambulatory Visit (INDEPENDENT_AMBULATORY_CARE_PROVIDER_SITE_OTHER): Payer: BC Managed Care – PPO | Admitting: Family Medicine

## 2023-06-18 VITALS — BP 110/72 | HR 81 | Temp 98.2°F | Ht 68.0 in | Wt 161.2 lb

## 2023-06-18 DIAGNOSIS — Z13220 Encounter for screening for lipoid disorders: Secondary | ICD-10-CM | POA: Diagnosis not present

## 2023-06-18 DIAGNOSIS — Z23 Encounter for immunization: Secondary | ICD-10-CM | POA: Diagnosis not present

## 2023-06-18 DIAGNOSIS — Z130 Encounter for screening for diseases of the blood and blood-forming organs and certain disorders involving the immune mechanism: Secondary | ICD-10-CM | POA: Diagnosis not present

## 2023-06-18 DIAGNOSIS — Z0000 Encounter for general adult medical examination without abnormal findings: Secondary | ICD-10-CM | POA: Diagnosis not present

## 2023-06-18 NOTE — Patient Instructions (Addendum)
 Please stop by lab before you go If you have mychart- we will send your results within 3 business days of [place] receiving them.  If you do not have mychart- we will call you about results within 5 business days of [place] receiving them.  *please also note that you will see labs on mychart as soon as they post. I will later go in and write notes on them- will say "notes from Dr. Durene Cal"   Let me know if you donate blood  Recommended follow up: Return in about 1 year (around 06/17/2024) for physical or sooner if needed.Schedule b4 you leave.
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
## 2023-06-19 ENCOUNTER — Encounter: Payer: Self-pay | Admitting: Family Medicine

## 2023-06-19 LAB — COMPREHENSIVE METABOLIC PANEL
ALT: 19 U/L (ref 0–53)
AST: 18 U/L (ref 0–37)
Albumin: 4.4 g/dL (ref 3.5–5.2)
Alkaline Phosphatase: 49 U/L (ref 39–117)
BUN: 14 mg/dL (ref 6–23)
CO2: 27 meq/L (ref 19–32)
Calcium: 9.3 mg/dL (ref 8.4–10.5)
Chloride: 106 meq/L (ref 96–112)
Creatinine, Ser: 0.82 mg/dL (ref 0.40–1.50)
GFR: 106.24 mL/min (ref >60.00)
Glucose, Bld: 92 mg/dL (ref 70–99)
Potassium: 3.9 meq/L (ref 3.5–5.1)
Sodium: 141 meq/L (ref 135–145)
Total Bilirubin: 0.4 mg/dL (ref 0.2–1.2)
Total Protein: 7.1 g/dL (ref 6.0–8.3)

## 2023-06-19 LAB — CBC WITH DIFFERENTIAL/PLATELET
Basophils Absolute: 0.1 10*3/uL (ref 0.0–0.1)
Basophils Relative: 0.9 % (ref 0.0–3.0)
Eosinophils Absolute: 0.1 10*3/uL (ref 0.0–0.7)
Eosinophils Relative: 1.1 % (ref 0.0–5.0)
HCT: 43.9 % (ref 39.0–52.0)
Hemoglobin: 14.8 g/dL (ref 13.0–17.0)
Lymphocytes Relative: 20.3 % (ref 12.0–46.0)
Lymphs Abs: 1.2 10*3/uL (ref 0.7–4.0)
MCHC: 33.8 g/dL (ref 30.0–36.0)
MCV: 97.5 fl (ref 78.0–100.0)
Monocytes Absolute: 0.6 10*3/uL (ref 0.1–1.0)
Monocytes Relative: 11.4 % (ref 3.0–12.0)
Neutro Abs: 3.7 10*3/uL (ref 1.4–7.7)
Neutrophils Relative %: 66.3 % (ref 43.0–77.0)
Platelets: 346 10*3/uL (ref 150.0–400.0)
RBC: 4.5 Mil/uL (ref 4.22–5.81)
RDW: 13 % (ref 11.5–15.5)
WBC: 5.7 10*3/uL (ref 4.0–10.5)

## 2023-06-19 LAB — LIPID PANEL
Cholesterol: 201 mg/dL — ABNORMAL HIGH (ref 0–200)
HDL: 50.6 mg/dL (ref >39.00)
LDL Cholesterol: 96 mg/dL (ref 0–99)
NonHDL: 150.7
Total CHOL/HDL Ratio: 4
Triglycerides: 273 mg/dL — ABNORMAL HIGH (ref 0.0–149.0)
VLDL: 54.6 mg/dL — ABNORMAL HIGH (ref 0.0–40.0)

## 2023-11-19 ENCOUNTER — Encounter: Payer: Self-pay | Admitting: Family Medicine

## 2023-11-20 ENCOUNTER — Encounter: Payer: Self-pay | Admitting: Family

## 2023-11-20 ENCOUNTER — Ambulatory Visit: Admitting: Family

## 2023-11-20 VITALS — BP 116/72 | HR 67 | Temp 98.4°F | Ht 68.0 in | Wt 153.4 lb

## 2023-11-20 DIAGNOSIS — R52 Pain, unspecified: Secondary | ICD-10-CM | POA: Diagnosis not present

## 2023-11-20 DIAGNOSIS — M4722 Other spondylosis with radiculopathy, cervical region: Secondary | ICD-10-CM
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
## 2023-12-17 ENCOUNTER — Other Ambulatory Visit: Payer: Self-pay | Admitting: Family Medicine

## 2023-12-17 DIAGNOSIS — K648 Other hemorrhoids: Secondary | ICD-10-CM

## 2023-12-17 DIAGNOSIS — K625 Hemorrhage of anus and rectum: Secondary | ICD-10-CM

## 2023-12-20 ENCOUNTER — Other Ambulatory Visit: Payer: Self-pay | Admitting: Family Medicine

## 2023-12-20 ENCOUNTER — Encounter: Payer: Self-pay | Admitting: Family Medicine

## 2023-12-20 DIAGNOSIS — K648 Other hemorrhoids: Secondary | ICD-10-CM

## 2023-12-20 DIAGNOSIS — K625 Hemorrhage of anus and rectum: Secondary | ICD-10-CM

## 2024-01-07 MED ORDER — HYDROCORTISONE ACETATE 25 MG RE SUPP: 25.0000 mg | Freq: Two times a day (BID) | RECTAL | 2 refills | Status: AC

## 2024-01-28 ENCOUNTER — Encounter: Payer: Self-pay | Admitting: Family Medicine

## 2024-01-28 ENCOUNTER — Other Ambulatory Visit: Payer: Self-pay

## 2024-01-28 MED ORDER — TRIAMCINOLONE ACETONIDE 0.1 % EX LOTN: TOPICAL_LOTION | Freq: Two times a day (BID) | CUTANEOUS | 3 refills | Status: DC

## 2024-01-29 MED ORDER — TRIAMCINOLONE ACETONIDE 0.1 % EX CREA: 1.0000 | TOPICAL_CREAM | Freq: Two times a day (BID) | CUTANEOUS | 0 refills | Status: DC

## 2024-01-29 NOTE — Telephone Encounter (Signed)
 Okay to order from historical meds?

## 2024-02-26 ENCOUNTER — Ambulatory Visit: Payer: Self-pay

## 2024-02-26 NOTE — Telephone Encounter (Signed)
 Patient scheduled to see Dr. Katrinka 02/27/2024. Dr. Katrinka will review triage notes.

## 2024-02-26 NOTE — Telephone Encounter (Signed)
 FYI Only or Action Required?: FYI only for provider: patient was already scheduled to see PCP tomorrow at 11:20 AM for a separate issue-patient states he will speak to his provider about his rectal bleeding.  Patient was last seen in primary care on 11/20/2023 by Lucius Krabbe, NP.  Called Nurse Triage reporting Rectal Bleeding.  Symptoms began several weeks ago.  Interventions attempted: Rest, hydration, or home remedies.  Symptoms are: unchanged.  Triage Disposition: See Physician Within 24 Hours  Patient/caregiver understands and will follow disposition?: Yes  Copied from CRM #[id number]. Topic: Clinical - Red Word Triage >> Feb 26, 2024  8:48 AM Mesmerise C wrote: [facility] that prompted transfer to Nurse Triage: Patient has been having rectal bleeding due to hemmroids states it's not getting better at all Reason for Disposition  MODERATE rectal bleeding (e.g., small blood clots, passing blood without stool, or toilet water turns red)  Answer Assessment - Initial Assessment Questions Patient reports rectal bleeding that he has seen his provider for and has been in contact. Patient states no changes to his rectal bleeding-happens with bowel movements. Patient is wanting to speak with his provider in regards to the rectal bleeding before going to a GI specialist. Patient is scheduled to see PCP tomorrow for a separate issue and plans to speak to his PCP at that time. ED precautions given to patient if rectal bleeding worsens along with any other symptoms.   1. APPEARANCE of BLOOD: What color is it? Is it passed separately, on the surface of the stool, or mixed in with the stool?      Patient states mainly passing seperately. Bright red blood 2. AMOUNT: How much blood was passed?      Unsure how much but the toilet water is changing colors.  3. FREQUENCY: How many times has blood been passed with the stools?      Every time he has a bowel movement-has been going on for a  while 4. ONSET: When was the blood first seen in the stools? (Days or weeks)      A good while 5. DIARRHEA: Is there also some diarrhea? If Yes, ask: How many diarrhea stools in the past 24 hours?      no 6. CONSTIPATION: Do you have constipation? If Yes, ask: How bad is it?     no 7. RECURRENT SYMPTOMS: Have you had blood in your stools before? If Yes, ask: When was the last time? and What happened that time?      No 8. BLOOD THINNERS: Do you take any blood thinners? (e.g., aspirin, clopidogrel / Plavix, coumadin, heparin). Notes: Other strong blood thinners include: Arixtra (fondaparinux), Eliquis (apixaban), Pradaxa (dabigatran), and Xarelto (rivaroxaban).     no 9. OTHER SYMPTOMS: Do you have any other symptoms?  (e.g., abdomen pain, vomiting, dizziness, fever)     No other symptoms.  Protocols used: Rectal Bleeding-A-AH

## 2024-02-27 ENCOUNTER — Encounter: Payer: Self-pay | Admitting: Family Medicine

## 2024-02-27 ENCOUNTER — Ambulatory Visit: Admitting: Family Medicine

## 2024-02-27 VITALS — BP 108/68 | HR 68 | Temp 98.6°F | Ht 68.0 in | Wt 160.6 lb

## 2024-02-27 DIAGNOSIS — K648 Other hemorrhoids: Secondary | ICD-10-CM

## 2024-02-27 DIAGNOSIS — L301 Dyshidrosis [pompholyx]: Secondary | ICD-10-CM

## 2024-02-27 DIAGNOSIS — Z23 Encounter for immunization: Secondary | ICD-10-CM | POA: Diagnosis not present

## 2024-02-27 DIAGNOSIS — R0683 Snoring: Secondary | ICD-10-CM

## 2024-02-27 MED ORDER — TRIAMCINOLONE ACETONIDE 0.5 % EX OINT: 1.0000 | TOPICAL_OINTMENT | Freq: Two times a day (BID) | CUTANEOUS | 5 refills | Status: AC

## 2024-02-27 NOTE — Progress Notes (Signed)
 Phone [phone] In person visit   Subjective:   Roy Webb is a 48 y.o. year old very pleasant male patient who presents for/with See problem oriented charting Chief Complaint  Patient presents with   Snoring    Per patients wife snoring is getting worse to where she cannot sleep and then he cannot sleep because she wakes him up; spoke with dentist about this today and they suggested sleep study;    Rectal Bleeding    Ongoing since last conversation about it; consistent bleeding with bowel movements; possibly referral to GI; possibly banding of internal hemorrhoids; no increased pain    Hemorrhoids    Past Medical History-  Patient Active Problem List   Diagnosis Date Noted   Hyperlipidemia 12/31/2018    Priority: Medium    Right cervical radiculopathy 06/15/2016    Priority: Low   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 16 g 6   hydrocortisone  (ANUSOL -HC) 25 MG suppository Place 1 suppository (25 mg total) rectally 2 (two) times daily. 12 suppository 2   triamcinolone  ointment (KENALOG ) 0.5 % Apply 1 Application topically 2 (two) times daily. For 7-10 days for eczema of hands 60 g 5   No current facility-administered medications for this visit.     Objective:  BP 108/68 (BP Location: Left Arm, Patient Position: Sitting, Cuff Size: Normal)   Pulse 68   Temp 98.6 F (37 C) (Temporal)   Ht 5' 8 (1.727 m)   Wt 160 lb 9.6 oz (72.8 kg)   SpO2 95%   BMI 24.42 kg/m  Gen: NAD, resting comfortably CV: RRR no murmurs rubs or gallops Lungs: CTAB no crackles, wheeze, rhonchi Ext: no edema Skin: warm, dry, dry skin in between fingers on both hands-some areas are cracked/split     Assessment and Plan   # Snoring S: Patient's wife has noted patient snoring and it seems to be worsening.  She is having to wake him up due to snoring being so loud and then he has a hard time falling back asleep.  Spoke with his dentist today and he suggested a sleep study -dad with OSA with similar build and reasonably fit -still wakes up un refreshed as ell A/P: daytime somnolence and With heavy snoring agree with need for sleep study-refer to pulmonology today  # Rectal bleeding/hemorrhoids S:from march visit patient had been seen for rectal bleeding 01/11/2021 by Dr. Eda. There was concern that time for external hemorrhoids that have receded by time of visit as patient with history of thrombosed hemorrhoids. There is a question of family history of colon cancer paternal uncle in 19s with plan for colonoscopy at that time which she had done on 01/25/2021. Had some polyps including 1 tubular adenoma and external and internal hemorrhoids were noted. Surveillance colonoscopy planned in 7 years. Had repeat visit 11/11/21- thought to be hemorrhoid related . No constipation. Gave red flags/warning symptoms    Patient is up-to-date on colonoscopy as above.  He has not been anemic- no chest pain or shortness of breath or dizziness . With that being said has had consistent bleeding with bowel movements for the last year . Suppositories did not make a difference  this time. External hemorrhoids in past but none have been bleeding.  A/P: With ongoing rectal bleeding likely due to internal hemorrhoids I have placed a referral to [facility] for their opinion on banding or if he needs further evaluation -offered repeat CBC but he declines at this time-he still has no left symptomatic  #dyshidrotic eczema hands S: does ok with dove at home but if travels and uses other soaps this can really flare. Gets bumps that become intensely pruritic- can even get splits in skin. Using triamcinolone   helps some. Has done gloves at night  Recent flare with recent travel but starting to improve after starting triamcinolone  back A/P: Dyshidrotic eczema poorly controlled but starting to improve with triamcinolone  0.1% cream.  We opted to increase to next level of strength of steroid with triamcinolone  0.5% and changed to ointment to see if he can potentially get quicker relief  Recommended follow up: Return for next already scheduled visit or sooner if needed. Future Appointments  Date Time Provider Department Center  06/19/2024  8:00 AM Katrinka Garnette KIDD, MD [facility]    Lab/Order associations:   ICD-10-CM   1. Snoring  R06.83 Ambulatory referral to Pulmonology    2. Internal hemorrhoids  K64.8 Ambulatory referral to Gastroenterology    3. Dyshidrotic eczema  L30.1     4. Immunization due  Z23 Flu vaccine trivalent PF, 6mos and older(Flulaval,Afluria,Fluarix,Fluzone)      Meds ordered this encounter  Medications   triamcinolone  ointment (KENALOG ) 0.5 %    Sig: Apply 1 Application topically 2 (two) times daily. For 7-10 days for eczema of hands    Dispense:  60 g    Refill:  5    Return precautions advised.  Garnette Katrinka, MD

## 2024-02-27 NOTE — Patient Instructions (Addendum)
 [facility] contact Please call to schedule visit IF you do not hear within a week Address: [address] [place], [place], [place] [address] Phone: [phone]   We have placed a referral for you today to [facility]- please call their # if you do not hear within a week (may be listed below or you may see mychart message within a few days with #).   Trial stronger ointment instead of cream for the hands  Recommended follow up: Return for next already scheduled visit or sooner if needed.

## 2024-03-13 ENCOUNTER — Encounter: Payer: Self-pay | Admitting: Family Medicine

## 2024-03-13 MED ORDER — GABAPENTIN 300 MG PO CAPS: 300.0000 mg | ORAL_CAPSULE | Freq: Three times a day (TID) | ORAL | 3 refills | Status: AC | PRN

## 2024-03-13 NOTE — Telephone Encounter (Signed)
 Referral to sports med? Or come see you

## 2024-03-14 MED ORDER — PREDNISONE 20 MG PO TABS: ORAL_TABLET | ORAL | 0 refills | Status: AC

## 2024-03-25 ENCOUNTER — Ambulatory Visit (INDEPENDENT_AMBULATORY_CARE_PROVIDER_SITE_OTHER): Admitting: Adult Health

## 2024-03-25 ENCOUNTER — Encounter: Payer: Self-pay | Admitting: Adult Health

## 2024-03-25 VITALS — BP 114/82 | HR 75 | Temp 98.1°F | Ht 68.0 in | Wt 168.8 lb

## 2024-03-25 DIAGNOSIS — Z87891 Personal history of nicotine dependence: Secondary | ICD-10-CM

## 2024-03-25 DIAGNOSIS — R5383 Other fatigue: Secondary | ICD-10-CM | POA: Diagnosis not present

## 2024-03-25 DIAGNOSIS — Z8489 Family history of other specified conditions: Secondary | ICD-10-CM | POA: Diagnosis not present

## 2024-03-25 DIAGNOSIS — R0683 Snoring: Secondary | ICD-10-CM

## 2024-03-25 DIAGNOSIS — J309 Allergic rhinitis, unspecified: Secondary | ICD-10-CM
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
## 2024-03-25 NOTE — Patient Instructions (Signed)
 Set up for home sleep study  Saline nasal spray Twice daily   Do not drive if sleepy  Follow up in 6 weeks and As needed

## 2024-04-18 ENCOUNTER — Encounter

## 2024-04-18 DIAGNOSIS — R0683 Snoring: Secondary | ICD-10-CM

## 2024-04-23 ENCOUNTER — Telehealth: Payer: Self-pay | Admitting: Pulmonary Disease

## 2024-04-23 DIAGNOSIS — R0683 Snoring: Secondary | ICD-10-CM | POA: Diagnosis not present

## 2024-04-23 NOTE — Telephone Encounter (Signed)
 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  O2 nadir of 89%, no significant desaturations below 88%  Recommendation: Options for treating mild obstructive sleep apnea may include CPAP therapy if there are significant daytime symptoms or notable comorbidities. Auto CPAP 5-15 with heated humidification and the patient's preferred mask may be considered; other treatment options may include an oral device, watchful waiting with significant weight loss efforts. Avoid alcohol, sedatives and other CNS depressants that may worsen sleep apnea and disrupt normal sleep architecture. Sleep hygiene should be reviewed to assess factors that may improve sleep quality. Weight management and regular exercise should be initiated or continued

## 2024-04-29 ENCOUNTER — Ambulatory Visit: Payer: Self-pay | Admitting: Adult Health

## 2024-05-06 ENCOUNTER — Ambulatory Visit: Admitting: Adult Health

## 2024-06-19 ENCOUNTER — Encounter: Admitting: Family Medicine
